# Patient Record
Sex: Female | Born: 1975 | Race: Black or African American | Hispanic: No | Marital: Single | Smoking: Current every day smoker
Health system: Southern US, Community
[De-identification: ages and names within clinical notes are randomized; demographics above are authoritative.]

## PROBLEM LIST (undated history)

## (undated) DIAGNOSIS — O021 Missed abortion: Secondary | ICD-10-CM

## (undated) DIAGNOSIS — IMO0002 Reserved for concepts with insufficient information to code with codable children: Secondary | ICD-10-CM

## (undated) DIAGNOSIS — A749 Chlamydial infection, unspecified: Secondary | ICD-10-CM

## (undated) DIAGNOSIS — A64 Unspecified sexually transmitted disease: Secondary | ICD-10-CM

## (undated) HISTORY — DX: Chlamydial infection, unspecified: A74.9

## (undated) HISTORY — DX: Reserved for concepts with insufficient information to code with codable children: IMO0002

## (undated) HISTORY — DX: Unspecified sexually transmitted disease: A64

---

## 1997-05-30 ENCOUNTER — Emergency Department (HOSPITAL_COMMUNITY): Admission: EM | Admit: 1997-05-30 | Discharge: 1997-05-30 | Payer: Self-pay | Admitting: Emergency Medicine

## 1997-06-03 ENCOUNTER — Encounter: Admission: RE | Admit: 1997-06-03 | Discharge: 1997-09-01 | Payer: Self-pay | Admitting: Family Medicine

## 1999-04-22 ENCOUNTER — Ambulatory Visit (HOSPITAL_COMMUNITY): Admission: RE | Admit: 1999-04-22 | Discharge: 1999-04-22 | Payer: Self-pay | Admitting: *Deleted

## 1999-04-22 ENCOUNTER — Encounter: Payer: Self-pay | Admitting: *Deleted

## 1999-05-18 ENCOUNTER — Inpatient Hospital Stay (HOSPITAL_COMMUNITY): Admission: AD | Admit: 1999-05-18 | Discharge: 1999-05-18 | Payer: Self-pay | Admitting: *Deleted

## 1999-06-02 ENCOUNTER — Encounter: Payer: Self-pay | Admitting: *Deleted

## 1999-06-02 ENCOUNTER — Inpatient Hospital Stay (HOSPITAL_COMMUNITY): Admission: AD | Admit: 1999-06-02 | Discharge: 1999-06-04 | Payer: Self-pay | Admitting: *Deleted

## 1999-06-02 ENCOUNTER — Ambulatory Visit (HOSPITAL_COMMUNITY): Admission: RE | Admit: 1999-06-02 | Discharge: 1999-06-02 | Payer: Self-pay | Admitting: *Deleted

## 1999-09-10 ENCOUNTER — Emergency Department (HOSPITAL_COMMUNITY): Admission: EM | Admit: 1999-09-10 | Discharge: 1999-09-10 | Payer: Self-pay | Admitting: Emergency Medicine

## 2002-10-25 ENCOUNTER — Other Ambulatory Visit: Admission: RE | Admit: 2002-10-25 | Discharge: 2002-10-25 | Payer: Self-pay | Admitting: Family Medicine

## 2004-07-20 ENCOUNTER — Other Ambulatory Visit: Admission: RE | Admit: 2004-07-20 | Discharge: 2004-07-20 | Payer: Self-pay | Admitting: Family Medicine

## 2005-10-07 ENCOUNTER — Other Ambulatory Visit: Admission: RE | Admit: 2005-10-07 | Discharge: 2005-10-07 | Payer: Self-pay | Admitting: Family Medicine

## 2007-02-02 DIAGNOSIS — IMO0002 Reserved for concepts with insufficient information to code with codable children: Secondary | ICD-10-CM

## 2007-02-02 HISTORY — DX: Reserved for concepts with insufficient information to code with codable children: IMO0002

## 2007-02-02 HISTORY — PX: COLPOSCOPY: SHX161

## 2007-06-28 ENCOUNTER — Other Ambulatory Visit: Admission: RE | Admit: 2007-06-28 | Discharge: 2007-06-28 | Payer: Self-pay | Admitting: Family Medicine

## 2010-06-19 NOTE — H&P (Signed)
[facility]  Patient:    Anne Reynolds, Anne Reynolds                    MRN: [id number] Adm. Date:  [id number] Attending:  Deniece Ree                         History and Physical  HISTORY OF PRESENT ILLNESS:   The patient is a 35 year old, gravida 2, para 1, ho came to my office today for a routine prenatal care and it was noted at that time that no fetal heart beats were attainable.  An ultrasound was then obtained which revealed a fetal demise.  The patient is approximately [redacted] weeks gestation and indicated that she thought she had felt the baby move, however, was not sure because she did not know when she was feeling it versus not feeling it.  PAST MEDICAL HISTORY:         Significant in that the patient in 1995 had a female infant weighing 1 pound 12 ounces secondary to a cesarean section at the gestational age of [redacted] weeks because of premature rupture of membranes and a prolapsed cord.  She had not had any problems during the present pregnancy.  PHYSICAL EXAMINATION:  GENERAL:                      Revealed a well-developed, well-nourished, gravid  female in mental-type distress.  HEENT:                        Within normal limits.  NECK:                         Supple.  BREASTS:                      Without masses, tenderness, or discharge.  LUNGS:                        Clear to auscultation and percussion.  HEART:                        Normal sinus rhythm without murmurs, rubs, or gallops.  ABDOMEN:                      Approximately [redacted] weeks gestational size.  No fetal heart tones are audible.  EXTREMITIES: NEUROLOGICAL:    Within normal limits.  PELVIC:                       Revealed external genitalia and BUS to be within normal limits.  The vagina is clear.  The cervix is closed, posterior, and without effacement.  The adnexa is benign.  ADMISSION DIAGNOSES:          Intrauterine pregnancy at approximately [redacted] weeks gestation  with fetal demise.  PLAN:                         For admission and delivery to utilize Cervidil to  institute the delivery process. DD:  06/02/99 TD:  06/04/99 Job: [id number]

## 2010-06-19 NOTE — Discharge Summary (Signed)
[facility]  Patient:    Anne Reynolds, Anne Reynolds                    MRN: [id number] Adm. Date:  [id number] Disc. Date: [id number] Attending:  Deniece Ree                           Discharge Summary  DISCHARGE DIAGNOSES:          1. Intrauterine pregnancy at approximately 26 weeks.                               2. Fetal demise.  PROCEDURE:                    Vaginal delivery.  HISTORY OF PRESENT ILLNESS:   The patient is a 35 year old, gravida 2, para 1, ho came to my office for a routine evaluation and was noted not to have fetal heart tones obtainable.  An ultrasound was obtained which revealed fetal demise. This was explained to the patient at which time a decision was to proceed with delivery.  HOSPITAL COURSE:              The patient was admitted for delivery at which time Cervidil was utilized to help in the delivery process.  The patient progressed satisfactorily and the patient progressed and had a spontaneous vaginal delivery of a female infant with Apgars of 0 and 0.  A discussion over this type of occurrence was carried out with the patient and family members who all understood. Postdelivery, the patient did very well without any problems and was discharged on the second postdelivery day. DD:  07/08/99 TD:  07/09/99 Job: [id number]

## 2010-12-23 ENCOUNTER — Other Ambulatory Visit: Payer: Self-pay | Admitting: Obstetrics and Gynecology

## 2010-12-23 DIAGNOSIS — E041 Nontoxic single thyroid nodule: Secondary | ICD-10-CM

## 2010-12-28 ENCOUNTER — Other Ambulatory Visit: Payer: Self-pay

## 2011-01-11 ENCOUNTER — Other Ambulatory Visit: Payer: Self-pay

## 2011-07-16 ENCOUNTER — Encounter (HOSPITAL_COMMUNITY): Payer: Self-pay | Admitting: *Deleted

## 2011-07-16 ENCOUNTER — Inpatient Hospital Stay (HOSPITAL_COMMUNITY): Payer: Medicaid Other

## 2011-07-16 ENCOUNTER — Inpatient Hospital Stay (HOSPITAL_COMMUNITY)
Admission: AD | Admit: 2011-07-16 | Discharge: 2011-07-16 | Disposition: A | Discharge status: Home / Self Care | Payer: Medicaid Other | Source: Ambulatory Visit | Attending: Obstetrics & Gynecology | Admitting: Obstetrics & Gynecology

## 2011-07-16 DIAGNOSIS — A499 Bacterial infection, unspecified: Secondary | ICD-10-CM | POA: Insufficient documentation

## 2011-07-16 DIAGNOSIS — O200 Threatened abortion: Secondary | ICD-10-CM

## 2011-07-16 DIAGNOSIS — O2390 Unspecified genitourinary tract infection in pregnancy, unspecified trimester: Secondary | ICD-10-CM | POA: Insufficient documentation

## 2011-07-16 DIAGNOSIS — N760 Acute vaginitis: Secondary | ICD-10-CM | POA: Insufficient documentation

## 2011-07-16 DIAGNOSIS — B9689 Other specified bacterial agents as the cause of diseases classified elsewhere: Secondary | ICD-10-CM | POA: Insufficient documentation

## 2011-07-16 DIAGNOSIS — R109 Unspecified abdominal pain: Secondary | ICD-10-CM | POA: Insufficient documentation

## 2011-07-16 LAB — WET PREP, GENITAL

## 2011-07-16 LAB — HCG, QUANTITATIVE, PREGNANCY: hCG, Beta Chain, Quant, S: 942 m[IU]/mL — ABNORMAL HIGH (ref <5)

## 2011-07-16 MED ORDER — METRONIDAZOLE 500 MG PO TABS: 500.0000 mg | ORAL_TABLET | Freq: Three times a day (TID) | ORAL | Status: AC

## 2011-07-16 NOTE — MAU Note (Signed)
Pt complains of cramping and sharp pains for approximately 3 weeks. Pt denies any bleeding and states last period was May the 3rd. States she is worried because she had complications with last 2 pregnancies

## 2011-07-16 NOTE — Discharge Instructions (Signed)
Threatened Miscarriage  Bleeding during the first 20 weeks of pregnancy is common. This is sometimes called a threatened miscarriage. This is a pregnancy that is threatening to end before the twentieth week of pregnancy. Often this bleeding stops with bed rest or decreased activities as suggested by your caregiver and the pregnancy continues without any more problems. You may be asked to not have sexual intercourse, have orgasms or use tampons until further notice. Sometimes a threatened miscarriage can progress to a complete or incomplete miscarriage. This may or may not require further treatment. Some miscarriages occur before a woman misses a menstrual period and knows she is pregnant.  Miscarriages occur in 15 to 20% of all pregnancies and usually occur during the first 13 weeks of the pregnancy. The exact cause of a miscarriage is usually never known. A miscarriage is natures way of ending a pregnancy that is abnormal or would not make it to term. There are some things that may put you at risk to have a miscarriage, such as:   Hormone problems.   Infection of the uterus or cervix.   Chronic illness, diabetes for example, especially if it is not controlled.   Abnormal shaped uterus.   Fibroids in the uterus.   Incompetent cervix (the cervix is too weak to hold the baby).   Smoking.   Drinking too much alcohol. It's best not to drink any alcohol when you are pregnant.   Taking illegal drugs.  TREATMENT   When a miscarriage becomes complete and all products of conception (all the tissue in the uterus) have been passed, often no treatment is needed. If you think you passed tissue, save it in a container and take it to your doctor for evaluation. If the miscarriage is incomplete (parts of the fetus or placenta remain in the uterus), further treatment may be needed. The most common reason for further treatment is continued bleeding (hemorrhage) because pregnancy tissue did not pass out of the uterus. This  often occurs if a miscarriage is incomplete. Tissue left behind may also become infected. Treatment usually is dilatation and curettage (the removal of the remaining products of pregnancy. This can be done by a simple sucking procedure (suction curettage) or a simple scraping of the inside of the uterus. This may be done in the hospital or in the caregiver's office. This is only done when your caregiver knows that there is no chance for the pregnancy to proceed to term. This is determined by physical examination, negative pregnancy test, falling pregnancy hormone count and/or, an ultrasound revealing a dead fetus.  Miscarriages are often a very emotional time for prospective mothers and fathers. This is not you or your partners fault. It did not occur because of an inadequacy in you or your partner. Nearly all miscarriages occur because the pregnancy has started off wrongly. At least half of these pregnancies have a chromosomal abnormality. It is almost always not inherited. Others may have developmental problems with the fetus or placenta. This does not always show up even when the products miscarried are studied under the microscope. The miscarriage is nearly always not your fault and it is not likely that you could have prevented it from happening. If you are having emotional and grieving problems, talk to your health care provider and even seek counseling, if necessary, before getting pregnant again. You can begin trying for another pregnancy as soon as your caregiver says it is OK.  HOME CARE INSTRUCTIONS    Your caregiver may order   and cramping you are having. You may be limited to only getting up to go to the bathroom. You may be allowed to continue light activity. You may need to make arrangements for the care of your other children and for any other responsibilities.   Keep track of the number of pads you use each day, how often you have to change pads  and how saturated (soaked) they are. Record this information.   DO NOT USE TAMPONS. Do not douche, have sexual intercourse or orgasms until approved by your caregiver.   You may receive a follow up appointment for re-evaluation of your pregnancy and a repeat blood test. Re-evaluation often occurs after 2 days and again in 4 to 6 weeks. It is very important that you follow-up in the recommended time period.   If you are Rh negative and the father is Rh positive or you do not know the fathers' blood type, you may receive a shot (Rh immune globulin) to help prevent abnormal antibodies that can develop and affect the baby in any future pregnancies.  SEEK IMMEDIATE MEDICAL CARE IF:  You have severe cramps in your stomach, back, or abdomen.   You have a sudden onset of severe pain in the lower part of your abdomen.   You develop chills.   You run an unexplained temperature of 101 F (38.3 C) or higher.   You pass large clots or tissue. Save any tissue for your caregiver to inspect.   Your bleeding increases or you become light-headed, weak, or have fainting episodes.   You have a gush of fluid from your vagina.   You pass out. This could mean you have a tubal (ectopic) pregnancy.  Document Released: 01/18/2005 Document Revised: 01/07/2011 Document Reviewed: 09/04/2007 [facility] Patient Information 2012 [place], [place].  Bacterial Vaginosis Bacterial vaginosis (BV) is a vaginal infection where the normal balance of bacteria in the vagina is disrupted. The normal balance is then replaced by an overgrowth of certain bacteria. There are several different kinds of bacteria that can cause BV. BV is the most common vaginal infection in women of childbearing age. CAUSES   The cause of BV is not fully understood. BV develops when there is an increase or imbalance of harmful bacteria.   Some activities or behaviors can upset the normal balance of bacteria in the vagina and put women at increased  risk including:   Having a new sex partner or multiple sex partners.   Douching.   Using an intrauterine device (IUD) for contraception.   It is not clear what role sexual activity plays in the development of BV. However, women that have never had sexual intercourse are rarely infected with BV.  Women do not get BV from toilet seats, bedding, swimming pools or from touching objects around them.  SYMPTOMS   Grey vaginal discharge.   A fish-like odor with discharge, especially after sexual intercourse.   Itching or burning of the vagina and vulva.   Burning or pain with urination.   Some women have no signs or symptoms at all.  DIAGNOSIS  Your caregiver must examine the vagina for signs of BV. Your caregiver will perform lab tests and look at the sample of vaginal fluid through a microscope. They will look for bacteria and abnormal cells (clue cells), a pH test higher than 4.5, and a positive amine test all associated with BV.  RISKS AND COMPLICATIONS   Pelvic inflammatory disease (PID).   Infections following gynecology surgery.   Developing  HIV.   Developing herpes virus.  TREATMENT  Sometimes BV will clear up without treatment. However, all women with symptoms of BV should be treated to avoid complications, especially if gynecology surgery is planned. Female partners generally do not need to be treated. However, BV may spread between female sex partners so treatment is helpful in preventing a recurrence of BV.   BV may be treated with antibiotics. The antibiotics come in either pill or vaginal cream forms. Either can be used with nonpregnant or pregnant women, but the recommended dosages differ. These antibiotics are not harmful to the baby.   BV can recur after treatment. If this happens, a second round of antibiotics will often be prescribed.   Treatment is important for pregnant women. If not treated, BV can cause a premature delivery, especially for a pregnant woman who had  a premature birth in the past. All pregnant women who have symptoms of BV should be checked and treated.   For chronic reoccurrence of BV, treatment with a type of prescribed gel vaginally twice a week is helpful.  HOME CARE INSTRUCTIONS   Finish all medication as directed by your caregiver.   Do not have sex until treatment is completed.   Tell your sexual partner that you have a vaginal infection. They should see their caregiver and be treated if they have problems, such as a mild rash or itching.   Practice safe sex. Use condoms. Only have 1 sex partner.  PREVENTION  Basic prevention steps can help reduce the risk of upsetting the natural balance of bacteria in the vagina and developing BV:  Do not have sexual intercourse (be abstinent).   Do not douche.   Use all of the medicine prescribed for treatment of BV, even if the signs and symptoms go away.   Tell your sex partner if you have BV. That way, they can be treated, if needed, to prevent reoccurrence.  SEEK MEDICAL CARE IF:   Your symptoms are not improving after 3 days of treatment.   You have increased discharge, pain, or fever.  MAKE SURE YOU:   Understand these instructions.   Will watch your condition.   Will get help right away if you are not doing well or get worse.  FOR MORE INFORMATION  Division of STD Prevention (DSTDP), Centers for Disease Control and Prevention: [employer] American Social Health Association (ASHA): www.ashastd.org  Document Released: 01/18/2005 Document Revised: 01/07/2011 Document Reviewed: 07/11/2008 [facility] Patient Information 2012 [place], [place].

## 2011-07-16 NOTE — MAU Provider Note (Signed)
History     CSN: [id number]  Arrival date and time: 07/16/11 1158   None     Chief Complaint  Patient presents with  . Abdominal Cramping   HPI This is a 36 y.o. female at 6.0 weeks by LMP who presents with c/o cramping pains in abdomen. Had + UPT at home. Denies abnormal bleeding. LMP 06/04/11  RN Note: Pt complains of cramping and sharp pains for approximately 3 weeks. Pt denies any bleeding and states last period was May the 3rd. States she is worried because she had complications with last 2 pregnancies  OB History    Grav Para Term Preterm Abortions TAB SAB Ect Mult Living   2 2 0 2 0 0 0 0 0 1       Past Medical History  Diagnosis Date  . Preterm labor     History reviewed. No pertinent past surgical history.  No family history on file.  History  Substance Use Topics  . Smoking status: Never Smoker   . Smokeless tobacco: Not on file  . Alcohol Use: No    Allergies: Not on File  Prescriptions prior to admission  Medication Sig Dispense Refill  . ibuprofen (ADVIL,MOTRIN) 200 MG tablet Take 200 mg by mouth every 6 (six) hours as needed. For cramping.      . Multiple Vitamins-Minerals (MULTIVITAMIN WITH MINERALS) tablet Take 1 tablet by mouth daily.        ROS See HPI  Physical Exam   Blood pressure 112/64, pulse 88, temperature 99.6 F (37.6 C), temperature source Oral, resp. rate 18.  Physical Exam  Constitutional: She is oriented to person, place, and time. She appears well-developed and well-nourished. No distress.  Cardiovascular: Normal rate.   Respiratory: Effort normal.  GI: Soft. She exhibits no distension and no mass. There is tenderness (slightly tender over SP area, no focal tenderness over adnexae). There is no rebound and no guarding.  Genitourinary: Vagina normal and uterus normal. No vaginal discharge found.       Uterus small, 6 wk size, nontender over adnexae   Musculoskeletal: Normal range of motion.  Neurological: She is alert and  oriented to person, place, and time.  Skin: Skin is warm and dry.  Psychiatric: She has a normal mood and affect.   Results for orders placed during the hospital encounter of 07/16/11 (from the past 24 hour(s))  POCT PREGNANCY, URINE     Status: Abnormal   Collection Time   07/16/11 12:47 PM      Component Value Range   Preg Test, Ur POSITIVE (*) NEGATIVE  HCG, QUANTITATIVE, PREGNANCY     Status: Abnormal   Collection Time   07/16/11  1:08 PM      Component Value Range   hCG, Beta Chain, Quant, S 942 (*) <5 mIU/mL  WET PREP, GENITAL     Status: Abnormal   Collection Time   07/16/11  1:45 PM      Component Value Range   Yeast Wet Prep HPF POC NONE SEEN  NONE SEEN   Trich, Wet Prep NONE SEEN  NONE SEEN   Clue Cells Wet Prep HPF POC MODERATE (*) NONE SEEN   WBC, Wet Prep HPF POC FEW (*) NONE SEEN    MAU Course  Procedures  MDM Will check Quant and [place].    Assessment and Plan  A:  Pregnancy at early gestation, Early IUP vs ectopic with pseudosac      BV  P:  Discharge home.  Repeat Quant on Monday per Dr Tresa Res then have patient come to their office to be seen.  (order for Quant HCG placed for Monday for outpatient lab).  Will not need to be seen in MAU.      Rx Flagyl for BV  [facility] 07/16/2011, 2:46 PM

## 2011-07-19 ENCOUNTER — Inpatient Hospital Stay (HOSPITAL_COMMUNITY)
Admission: AD | Admit: 2011-07-19 | Discharge: 2011-07-19 | Disposition: A | Discharge status: Home / Self Care | Payer: Medicaid Other | Source: Ambulatory Visit | Attending: Obstetrics & Gynecology | Admitting: Obstetrics & Gynecology

## 2011-07-19 NOTE — MAU Note (Signed)
Per note by Artelia Laroche. Pt to have BHCG  Outpatient lab only, then to follow up with Dr Romine's office.  Discussed/explained to pt.

## 2011-07-20 ENCOUNTER — Other Ambulatory Visit: Payer: Self-pay | Admitting: Obstetrics & Gynecology

## 2011-07-20 DIAGNOSIS — O200 Threatened abortion: Secondary | ICD-10-CM

## 2011-07-21 ENCOUNTER — Other Ambulatory Visit: Payer: Self-pay | Admitting: Obstetrics & Gynecology

## 2011-07-21 ENCOUNTER — Inpatient Hospital Stay (HOSPITAL_COMMUNITY)
Admission: AD | Admit: 2011-07-21 | Discharge: 2011-07-21 | Disposition: A | Discharge status: Home / Self Care | Payer: Medicaid Other | Source: Ambulatory Visit | Attending: Obstetrics & Gynecology | Admitting: Obstetrics & Gynecology

## 2011-07-22 ENCOUNTER — Other Ambulatory Visit: Payer: Self-pay | Admitting: Obstetrics & Gynecology

## 2011-07-22 ENCOUNTER — Ambulatory Visit (HOSPITAL_COMMUNITY)
Admission: RE | Admit: 2011-07-22 | Discharge: 2011-07-22 | Disposition: A | Discharge status: Home / Self Care | Payer: Medicaid Other | Source: Ambulatory Visit | Attending: Obstetrics & Gynecology | Admitting: Obstetrics & Gynecology

## 2011-07-22 DIAGNOSIS — O09299 Supervision of pregnancy with other poor reproductive or obstetric history, unspecified trimester: Secondary | ICD-10-CM | POA: Insufficient documentation

## 2011-07-22 DIAGNOSIS — O3680X0 Pregnancy with inconclusive fetal viability, not applicable or unspecified: Secondary | ICD-10-CM | POA: Insufficient documentation

## 2011-07-22 DIAGNOSIS — O0281 Inappropriate change in quantitative human chorionic gonadotropin (hCG) in early pregnancy: Secondary | ICD-10-CM

## 2011-07-22 DIAGNOSIS — O34219 Maternal care for unspecified type scar from previous cesarean delivery: Secondary | ICD-10-CM | POA: Insufficient documentation

## 2011-07-26 ENCOUNTER — Other Ambulatory Visit: Payer: Self-pay | Admitting: Obstetrics & Gynecology

## 2011-07-26 DIAGNOSIS — O0281 Inappropriate change in quantitative human chorionic gonadotropin (hCG) in early pregnancy: Secondary | ICD-10-CM

## 2011-07-28 ENCOUNTER — Encounter (HOSPITAL_COMMUNITY): Payer: Self-pay | Admitting: Pharmacy Technician

## 2011-07-28 ENCOUNTER — Encounter (HOSPITAL_COMMUNITY): Payer: Self-pay | Admitting: *Deleted

## 2011-07-28 ENCOUNTER — Ambulatory Visit (HOSPITAL_COMMUNITY)
Admission: RE | Admit: 2011-07-28 | Discharge: 2011-07-28 | Disposition: A | Discharge status: Home / Self Care | Payer: Medicaid Other | Source: Ambulatory Visit | Attending: Obstetrics & Gynecology | Admitting: Obstetrics & Gynecology

## 2011-07-28 DIAGNOSIS — O0281 Inappropriate change in quantitative human chorionic gonadotropin (hCG) in early pregnancy: Secondary | ICD-10-CM

## 2011-07-28 DIAGNOSIS — O34219 Maternal care for unspecified type scar from previous cesarean delivery: Secondary | ICD-10-CM | POA: Insufficient documentation

## 2011-07-28 DIAGNOSIS — O09299 Supervision of pregnancy with other poor reproductive or obstetric history, unspecified trimester: Secondary | ICD-10-CM | POA: Insufficient documentation

## 2011-07-28 DIAGNOSIS — O3680X0 Pregnancy with inconclusive fetal viability, not applicable or unspecified: Secondary | ICD-10-CM | POA: Insufficient documentation

## 2011-08-02 ENCOUNTER — Encounter (HOSPITAL_COMMUNITY): Payer: Self-pay | Admitting: *Deleted

## 2011-08-02 ENCOUNTER — Encounter (HOSPITAL_COMMUNITY): Payer: Self-pay

## 2011-08-02 ENCOUNTER — Ambulatory Visit (HOSPITAL_COMMUNITY)
Admission: RE | Admit: 2011-08-02 | Discharge: 2011-08-02 | Disposition: A | Discharge status: Home / Self Care | Payer: Medicaid Other | Source: Ambulatory Visit | Attending: Obstetrics & Gynecology | Admitting: Obstetrics & Gynecology

## 2011-08-02 ENCOUNTER — Ambulatory Visit (HOSPITAL_COMMUNITY): Payer: Medicaid Other

## 2011-08-02 ENCOUNTER — Encounter (HOSPITAL_COMMUNITY)
Admission: RE | Disposition: A | Discharge status: Home / Self Care | Payer: Self-pay | Source: Ambulatory Visit | Attending: Obstetrics & Gynecology

## 2011-08-02 DIAGNOSIS — N840 Polyp of corpus uteri: Secondary | ICD-10-CM | POA: Diagnosis present

## 2011-08-02 DIAGNOSIS — O020 Blighted ovum and nonhydatidiform mole: Secondary | ICD-10-CM | POA: Diagnosis present

## 2011-08-02 DIAGNOSIS — O021 Missed abortion: Secondary | ICD-10-CM | POA: Insufficient documentation

## 2011-08-02 HISTORY — PX: DILATION AND EVACUATION: SHX1459

## 2011-08-02 HISTORY — PX: DILATION AND CURETTAGE OF UTERUS: SHX78

## 2011-08-02 HISTORY — DX: Missed abortion: O02.1

## 2011-08-02 LAB — CBC
Hemoglobin: 13.2 g/dL (ref 12.0–15.0)
MCHC: 34.4 g/dL (ref 30.0–36.0)
RBC: 4.29 MIL/uL (ref 3.87–5.11)

## 2011-08-02 LAB — ABO/RH: ABO/RH(D): O POS

## 2011-08-02 SURGERY — DILATION AND EVACUATION, UTERUS
Anesthesia: Monitor Anesthesia Care | Site: Vagina | Wound class: Clean Contaminated

## 2011-08-02 MED ORDER — HYDROCODONE-ACETAMINOPHEN 5-325 MG PO TABS
ORAL_TABLET | ORAL | Status: AC
  Administered 2011-08-02: 2 via ORAL
  Filled 2011-08-02: qty 2

## 2011-08-02 MED ORDER — LIDOCAINE HCL (CARDIAC) 20 MG/ML IV SOLN
INTRAVENOUS | Status: AC
  Filled 2011-08-02: qty 5

## 2011-08-02 MED ORDER — ONDANSETRON HCL 4 MG/2ML IJ SOLN
INTRAMUSCULAR | Status: DC | PRN
  Administered 2011-08-02: 4 mg via INTRAVENOUS

## 2011-08-02 MED ORDER — FENTANYL CITRATE 0.05 MG/ML IJ SOLN
25.0000 ug | INTRAMUSCULAR | Status: DC | PRN
  Administered 2011-08-02: 50 ug via INTRAVENOUS

## 2011-08-02 MED ORDER — ATROPINE SULFATE 0.4 MG/ML IJ SOLN
INTRAMUSCULAR | Status: DC | PRN
  Administered 2011-08-02: 0.4 mg via INTRAVENOUS

## 2011-08-02 MED ORDER — ONDANSETRON HCL 4 MG/2ML IJ SOLN
INTRAMUSCULAR | Status: AC
  Filled 2011-08-02: qty 2

## 2011-08-02 MED ORDER — MEPERIDINE HCL 25 MG/ML IJ SOLN: 6.2500 mg | INTRAMUSCULAR | Status: DC | PRN

## 2011-08-02 MED ORDER — LIDOCAINE-EPINEPHRINE 1 %-1:100000 IJ SOLN
INTRAMUSCULAR | Status: DC | PRN
  Administered 2011-08-02: 10 mL

## 2011-08-02 MED ORDER — METOCLOPRAMIDE HCL 5 MG/ML IJ SOLN: 10.0000 mg | Freq: Once | INTRAMUSCULAR | Status: DC | PRN

## 2011-08-02 MED ORDER — KETOROLAC TROMETHAMINE 30 MG/ML IJ SOLN
INTRAMUSCULAR | Status: DC | PRN
  Administered 2011-08-02: 30 mg via INTRAMUSCULAR
  Administered 2011-08-02: 30 mg via INTRAVENOUS

## 2011-08-02 MED ORDER — FENTANYL CITRATE 0.05 MG/ML IJ SOLN
INTRAMUSCULAR | Status: AC
  Filled 2011-08-02: qty 2

## 2011-08-02 MED ORDER — DEXAMETHASONE SODIUM PHOSPHATE 10 MG/ML IJ SOLN
INTRAMUSCULAR | Status: AC
  Filled 2011-08-02: qty 1

## 2011-08-02 MED ORDER — LACTATED RINGERS IV SOLN
INTRAVENOUS | Status: DC
  Administered 2011-08-02 (×3): via INTRAVENOUS

## 2011-08-02 MED ORDER — PROPOFOL 10 MG/ML IV EMUL
INTRAVENOUS | Status: DC | PRN
  Administered 2011-08-02: 20 mg via INTRAVENOUS
  Administered 2011-08-02 (×4): 50 mg via INTRAVENOUS
  Administered 2011-08-02: 30 mg via INTRAVENOUS

## 2011-08-02 MED ORDER — KETOROLAC TROMETHAMINE 60 MG/2ML IM SOLN
INTRAMUSCULAR | Status: AC
  Filled 2011-08-02: qty 2

## 2011-08-02 MED ORDER — CEFAZOLIN SODIUM 1-5 GM-% IV SOLN
INTRAVENOUS | Status: AC
  Filled 2011-08-02: qty 50

## 2011-08-02 MED ORDER — PROPOFOL 10 MG/ML IV EMUL
INTRAVENOUS | Status: AC
  Filled 2011-08-02: qty 20

## 2011-08-02 MED ORDER — FENTANYL CITRATE 0.05 MG/ML IJ SOLN
INTRAMUSCULAR | Status: AC
  Administered 2011-08-02: 50 ug via INTRAVENOUS
  Filled 2011-08-02: qty 2

## 2011-08-02 MED ORDER — MIDAZOLAM HCL 5 MG/5ML IJ SOLN
INTRAMUSCULAR | Status: DC | PRN
  Administered 2011-08-02: 2 mg via INTRAVENOUS

## 2011-08-02 MED ORDER — CEFAZOLIN SODIUM 1-5 GM-% IV SOLN
1.0000 g | Freq: Three times a day (TID) | INTRAVENOUS | Status: DC
  Administered 2011-08-02: 1 g via INTRAVENOUS
  Filled 2011-08-02 (×5): qty 50

## 2011-08-02 MED ORDER — FENTANYL CITRATE 0.05 MG/ML IJ SOLN
INTRAMUSCULAR | Status: DC | PRN
  Administered 2011-08-02 (×2): 50 ug via INTRAVENOUS

## 2011-08-02 MED ORDER — HYDROCODONE-ACETAMINOPHEN 5-500 MG PO TABS: 2.0000 | ORAL_TABLET | Freq: Four times a day (QID) | ORAL | Status: AC | PRN

## 2011-08-02 MED ORDER — DEXAMETHASONE SODIUM PHOSPHATE 4 MG/ML IJ SOLN
INTRAMUSCULAR | Status: DC | PRN
  Administered 2011-08-02: 10 mg via INTRAVENOUS

## 2011-08-02 MED ORDER — MIDAZOLAM HCL 2 MG/2ML IJ SOLN
INTRAMUSCULAR | Status: AC
  Filled 2011-08-02: qty 2

## 2011-08-02 MED ORDER — HYDROCODONE-ACETAMINOPHEN 5-325 MG PO TABS
2.0000 | ORAL_TABLET | Freq: Once | ORAL | Status: AC
  Administered 2011-08-02: 2 via ORAL

## 2011-08-02 MED ORDER — ATROPINE SULFATE 0.4 MG/ML IJ SOLN
INTRAMUSCULAR | Status: AC
  Filled 2011-08-02: qty 1

## 2011-08-02 SURGICAL SUPPLY — 21 items
CATH ROBINSON RED A/P 16FR (CATHETERS) ×2 IMPLANT
CLOTH BEACON ORANGE TIMEOUT ST (SAFETY) ×2 IMPLANT
DECANTER SPIKE VIAL GLASS SM (MISCELLANEOUS) ×2 IMPLANT
GLOVE BIOGEL PI IND STRL 7.0 (GLOVE) ×1 IMPLANT
GLOVE BIOGEL PI INDICATOR 7.0 (GLOVE) ×1
GLOVE ECLIPSE 6.5 STRL STRAW (GLOVE) ×4 IMPLANT
GOWN PREVENTION PLUS LG XLONG (DISPOSABLE) ×4 IMPLANT
KIT BERKELEY 1ST TRIMESTER 3/8 (MISCELLANEOUS) ×2 IMPLANT
NDL SPNL 22GX3.5 QUINCKE BK (NEEDLE) ×1 IMPLANT
NEEDLE SPNL 22GX3.5 QUINCKE BK (NEEDLE) ×2 IMPLANT
NS IRRIG 1000ML POUR BTL (IV SOLUTION) ×2 IMPLANT
PACK VAGINAL MINOR WOMEN LF (CUSTOM PROCEDURE TRAY) ×2 IMPLANT
PAD PREP 24X48 CUFFED NSTRL (MISCELLANEOUS) ×2 IMPLANT
SET BERKELEY SUCTION TUBING (SUCTIONS) ×2 IMPLANT
SYR CONTROL 10ML LL (SYRINGE) ×2 IMPLANT
TOWEL OR 17X24 6PK STRL BLUE (TOWEL DISPOSABLE) ×4 IMPLANT
VACURETTE 10 RIGID CVD (CANNULA) IMPLANT
VACURETTE 6 ASPIR F TIP BERK (CANNULA) ×1 IMPLANT
VACURETTE 7MM CVD STRL WRAP (CANNULA) IMPLANT
VACURETTE 8 RIGID CVD (CANNULA) IMPLANT
VACURETTE 9 RIGID CVD (CANNULA) IMPLANT

## 2011-08-02 NOTE — Op Note (Signed)
08/02/2011  12:49 PM  PATIENT:  Anne Reynolds  36 y.o. female  PRE-OPERATIVE DIAGNOSIS:  Anembryonic pregnancy,endometrial polyp  POST-OPERATIVE DIAGNOSIS:  Same  PROCEDURE:  Procedure(s): DILATATION AND EVACUATION  SURGEON:  Zilpha Mcandrew SUZANNE  ASSISTANTS: OR staff   ANESTHESIA:   MAC  ESTIMATED BLOOD LOSS: * No blood loss amount entered *  BLOOD ADMINISTERED:none   FLUIDS: 600cc LR  UOP: 150cc drained at beginning of case with latex free catheter  SPECIMEN:  Products of conceptions, possible polyp  DISPOSITION OF SPECIMEN:  PATHOLOGY  FINDINGS: eight week sized uterus  DESCRIPTION OF OPERATION: Patient was taken to the operating room. She is placed in the supine position. Anesthesia was administered by the anesthesia staff without difficulty. Dr. foster oversaw case SCDs were on the patient's lower extremities and functioning properly.  The legs were then placed in the low lithotomy position in the [place] stirrups. A timeout was performed. Legs are lifted to the high lithotomy position.  The perineum, inner thighs, and vagina were prepped in a normal standard fashion with Betadine prep. The patient was then draped in a normal standard fashion. A Foley catheter is used to drain the bladder of all urine with an in and out catheterization. Next a bivalve speculum was placed in the vagina. The cervix was well visualized. The anterior lip of the cervix was grasped with single-tooth tenaculum. A paracervical block using 1% lidocaine mixed one-to-one with epinephrine (1:100,000 units) was used. 10 cc total was used for the paracervical block. The uterus then sounded to 8 cm. The cervix was dilated up to #21 using Pratt dilators.  A #6 flexible suction tip was obtained and passed into the endometrial cavity. Suction was applied. Using a clockwise rotating motion the endometrial cavity was evacuated of all tissue and blood. The suction never got above 60 during the procedure. Several  passes with the suction tip was performed. The dissection tip was removed and using a #1 smooth curette endometrial cavity was curetted. A rough gritty tissue is noted in all quadrants.   An endometrial polyp was seen on ultrasound in Nov, 2012.  I could not feel the presence of a polyp with curetting.  Therefore, no hysteroscopy was performed.  A final pass with the suction tip in the endometrial cavity was performed. Minimal blood was noted and no tissue was noted. At this point the procedure was ended. The tenaculum was removed from the anterior lip of the cervix. The speculum was removed from the vagina. The Betadine prep was washed off the patient's skin and her legs are placed back in the supine position. Sponge, lap, needle, initially counts were correct x2. The procedure was performed without any complications and the patient was awakened and taken to recovery in stable condition. An in and  COUNTS:  YES  PLAN OF CARE: Transfer to PACU

## 2011-08-02 NOTE — Transfer of Care (Signed)
Immediate Anesthesia Transfer of Care Note  Patient: Anne Reynolds  Procedure(s) Performed: Procedure(s) (LRB): DILATATION AND EVACUATION (N/A)  Patient Location: PACU  Anesthesia Type: MAC  Level of Consciousness: awake, alert  and oriented  Airway & Oxygen Therapy: Patient Spontanous Breathing and Patient connected to nasal cannula oxygen  Post-op Assessment: Report given to PACU RN and Post -op Vital signs reviewed and stable  Post vital signs: Reviewed and stable  Complications: No apparent anesthesia complications

## 2011-08-02 NOTE — Anesthesia Postprocedure Evaluation (Signed)
Anesthesia Post Note  Patient: Anne Reynolds  Procedure(s) Performed: Procedure(s) (LRB): DILATATION AND EVACUATION (N/A)  Anesthesia type: MAC  Patient location: PACU  Post pain: Pain level controlled  Post assessment: Post-op Vital signs reviewed  Last Vitals:  Filed Vitals:   08/02/11 1300  BP: 99/59  Pulse: 91  Temp:   Resp: 18    Post vital signs: Reviewed  Level of consciousness: sedated  Complications: No apparent anesthesia complications

## 2011-08-02 NOTE — Discharge Instructions (Signed)
Post-surgical Instructions, Outpatient Surgery  You may expect to feel dizzy, weak, and drowsy for as long as 24 hours after receiving the medicine that made you sleep (anesthetic). For the first 24 hours after your surgery:    Do not drive a car, ride a bicycle, participate in physical activities, or take public transportation until you are done taking narcotic pain medicines or as directed by Dr. Hyacinth Meeker.   Do not drink alcohol or take tranquilizers.   Do not take medicine that has not been prescribed by your physicians.   Do not sign important papers or make important decisions while on narcotic pain medicines.   Have a responsible person with you.   PAIN MANAGEMENT  Motrin 800mg .  (This is the same as 4-200mg  over the counter tablets of Motrin or ibuprofen.)  You may take this every eight hours or as needed for cramping.    Vicodin 5/500mg .  For more severe pain, take one or two tablets every four to six hours as needed for pain control.  (Remember that narcotic pain medications increase your risk of constipation.  If this becomes a problem, you may take an over the counter stool softener like Colace 100mg  up to four times a day.)  DO'S AND DON'T'S  Do not take a tub bath for one week.  You may shower on the first day after your surgery  Do not do any heavy lifting for one week.  This increases the chance of bleeding.  Do move around as you feel able.  Stairs are fine.  You may begin to exercise again as you feel able.  Do not lift any weights for two weeks.  Do not put anything in the vagina for two weeks--no tampons, intercourse, or douching.  No swimming or hot tub use is permitted during these two weeks as well.  REGULAR MEDIATIONS/VITAMINS:  You may restart all of your regular medications as prescribed.  You may restart all of your vitamins as you normally take them.    PLEASE CALL OR SEEK MEDICAL CARE IF:  You have persistent nausea and vomiting.   You have trouble  eating or drinking.   You have an oral temperature above 100.5.   You have constipation that is not helped by adjusting diet or increasing fluid intake. Pain medicines are a common cause of constipation.   You have heavy vaginal bleeding

## 2011-08-02 NOTE — H&P (Signed)
Anne Reynolds is an 36 y.o. female G69P0110 (stillbirth x 1) with LMP of 06/04/11 here for suction D&C due to anembryonic pregnancy.  Her B-HCGs have been followed and did not rise appropriately.  She has undergone three ultrasounds all showing normal tubes and gestation sac with possible yolk sac but no embryo.  Because of this treatment with conservative management, methotrexate, or D&E discussed.  She and I have reviewed risks/benenfits and she is here and ready to proceed.  Pertinent Gynecological History: Menses: regular, LMP 06/04/11 Bleeding: regular Contraception: none DES exposure: denies Blood transfusions: none Sexually transmitted diseases: past history: chlamydia years ago Previous GYN Procedures: C/S x 1, colposcopy  Last mammogram: none Date: n/a Last pap: normal Date: 11/12 OB History: G3, P0110 (stilbirth x 1)   Menstrual History: Menarche age: 89 Patient's last menstrual period was 06/04/2011.    Past Medical History  Diagnosis Date  . Preterm labor   . Missed abortion current    Past Surgical History  Procedure Date  . Cesarean section 1995    emergency -general anesthesia    History reviewed. No pertinent family history.  Social History:  reports that she has never smoked. She does not have any smokeless tobacco history on file. She reports that she drinks alcohol. She reports that she does not use illicit drugs.  Allergies:  Allergies  Allergen Reactions  . Latex Hives    Hives with condoms    Prescriptions prior to admission  Medication Sig Dispense Refill  . ibuprofen (ADVIL,MOTRIN) 200 MG tablet Take 200 mg by mouth every 6 (six) hours as needed. For cramping.      . Multiple Vitamins-Minerals (MULTIVITAMIN WITH MINERALS) tablet Take 1 tablet by mouth daily.        Review of Systems  Constitutional: Negative for fever and chills.  Eyes: Negative for blurred vision.  Respiratory: Negative for cough.   Cardiovascular: Negative for chest pain  and palpitations.  Gastrointestinal: Negative for heartburn, nausea, vomiting and abdominal pain.  Genitourinary: Negative for dysuria.  Musculoskeletal: Negative for myalgias.  Skin: Negative for rash.  Neurological: Negative for dizziness and headaches.  Endo/Heme/Allergies: Does not bruise/bleed easily.  Psychiatric/Behavioral: Negative for depression.    Blood pressure 111/69, pulse 99, temperature 98.4 F (36.9 C), temperature source Oral, resp. rate 18, height 5\' 2"  (1.575 m), weight 68.04 kg (150 lb), last menstrual period 06/04/2011, SpO2 98.00%, unknown if currently breastfeeding. Physical Exam  Vitals reviewed. Constitutional: She is oriented to person, place, and time. She appears well-developed and well-nourished.  HENT:  Head: Normocephalic and atraumatic.  Neck: Normal range of motion. Neck supple.  Cardiovascular: Normal rate and regular rhythm.   Respiratory: Effort normal and breath sounds normal.  GI: Soft. Bowel sounds are normal.  Musculoskeletal: Normal range of motion.  Neurological: She is alert and oriented to person, place, and time.  Skin: Skin is warm and dry.  Psychiatric: She has a normal mood and affect.    Results for orders placed during the hospital encounter of 08/02/11 (from the past 24 hour(s))  ABO/RH     Status: Normal   Collection Time   08/02/11 10:10 AM      Component Value Range   ABO/RH(D) O POS    CBC     Status: Normal   Collection Time   08/02/11 10:11 AM      Component Value Range   WBC 8.3  4.0 - 10.5 K/uL   RBC 4.29  3.87 - 5.11 MIL/uL  Hemoglobin 13.2  12.0 - 15.0 g/dL   HCT 16.1  09.6 - 04.5 %   MCV 89.5  78.0 - 100.0 fL   MCH 30.8  26.0 - 34.0 pg   MCHC 34.4  30.0 - 36.0 g/dL   RDW 40.9  81.1 - 91.4 %   Platelets 196  150 - 400 K/uL    No results found.  Assessment/Plan: 36 y.o. female G34P0110 (stillbirth x 1) with LMP of 06/04/11 here for suction D&C due to anembryonic pregnancy.  She does have a history of endometrial  polyp and I am going to try and remove this at the same time.  Risks and benefits were discussed and she is ready to proceed.  Anne Reynolds SUZANNE 08/02/2011, 11:59 AM

## 2011-08-02 NOTE — Anesthesia Preprocedure Evaluation (Addendum)
Anesthesia Evaluation  Patient identified by MRN, date of birth, ID band Patient awake    Reviewed: Allergy & Precautions, H&P , NPO status , Patient's Chart, lab work & pertinent test results  Airway Mallampati: II TM Distance: >3 FB Neck ROM: full    Dental No notable dental hx. (+) Teeth Intact   Pulmonary neg pulmonary ROS,  breath sounds clear to auscultation  Pulmonary exam normal       Cardiovascular negative cardio ROS  Rhythm:regular Rate:Normal     Neuro/Psych negative neurological ROS  negative psych ROS   GI/Hepatic negative GI ROS, Neg liver ROS,   Endo/Other  negative endocrine ROS  Renal/GU negative Renal ROS  negative genitourinary   Musculoskeletal   Abdominal Normal abdominal exam  (+)   Peds  Hematology negative hematology ROS (+)   Anesthesia Other Findings   Reproductive/Obstetrics (+) Pregnancy                           Anesthesia Physical Anesthesia Plan  ASA: II  Anesthesia Plan: MAC   Post-op Pain Management:    Induction:   Airway Management Planned:   Additional Equipment:   Intra-op Plan:   Post-operative Plan:   Informed Consent: I have reviewed the patients History and Physical, chart, labs and discussed the procedure including the risks, benefits and alternatives for the proposed anesthesia with the patient or authorized representative who has indicated his/her understanding and acceptance.   Dental Advisory Given  Plan Discussed with: Anesthesiologist, CRNA and Surgeon  Anesthesia Plan Comments:        Anesthesia Quick Evaluation

## 2011-08-03 ENCOUNTER — Encounter (HOSPITAL_COMMUNITY): Payer: Self-pay | Admitting: Obstetrics & Gynecology

## 2011-08-16 ENCOUNTER — Inpatient Hospital Stay (HOSPITAL_COMMUNITY)
Admission: AD | Admit: 2011-08-16 | Discharge: 2011-08-16 | Disposition: A | Discharge status: Home / Self Care | Payer: Medicaid Other | Source: Ambulatory Visit | Attending: Obstetrics & Gynecology | Admitting: Obstetrics & Gynecology

## 2012-07-28 ENCOUNTER — Encounter: Payer: Self-pay | Admitting: Certified Nurse Midwife

## 2012-08-03 ENCOUNTER — Ambulatory Visit (INDEPENDENT_AMBULATORY_CARE_PROVIDER_SITE_OTHER): Payer: BC Managed Care – PPO | Admitting: Certified Nurse Midwife

## 2012-08-03 ENCOUNTER — Encounter: Payer: Self-pay | Admitting: Certified Nurse Midwife

## 2012-08-03 VITALS — BP 100/66 | HR 82 | Resp 16 | Ht 64.0 in | Wt 151.0 lb

## 2012-08-03 DIAGNOSIS — Z01419 Encounter for gynecological examination (general) (routine) without abnormal findings: Secondary | ICD-10-CM

## 2012-08-03 DIAGNOSIS — Z0000 Encounter for general adult medical examination without abnormal findings: Secondary | ICD-10-CM

## 2012-08-03 LAB — POCT URINALYSIS DIPSTICK: Urobilinogen, UA: NEGATIVE

## 2012-08-03 NOTE — Patient Instructions (Signed)

## 2012-08-03 NOTE — Progress Notes (Signed)
37 y.o. [id number] Single African American Fe here for annual exam.  Periods normal, but some times heavy, but no issues. Contraception none desired. No partner change, desires STD screening. No health issues today.  Patient's last menstrual period was 07/21/2012.          Sexually active: yes  The current method of family planning is none.    Exercising: yes  walking, jogging, P90X 3x/wk Smoker:  no  Health Maintenance: Pap:  12/14/10 MMG:  none Colonoscopy:  none BMD:   no TDaP:  Not sure  Labs-  Hgb:  13.9    Urine:  negative   reports that she has never smoked. She does not have any smokeless tobacco history on file. She reports that  drinks alcohol. She reports that she does not use illicit drugs.  Past Medical History  Diagnosis Date  . Preterm labor   . Missed abortion current  . Abnormal Pap smear 2009    with colpo/bx + HPV- did f/u  . Chlamydia   . STD (sexually transmitted disease)     Past Surgical History  Procedure Laterality Date  . Dilation and evacuation  08/02/2011    Procedure: DILATATION AND EVACUATION;  Surgeon: Annamaria Boots, MD;  Location: [facility];  Service: Gynecology;  Laterality: N/A;  . Colposcopy  2009  . Cesarean section  1995    emergency -general anesthesia  . Dilation and curettage of uterus  7/13    Current Outpatient Prescriptions  Medication Sig Dispense Refill  . Ascorbic Acid (VITAMIN C PO) Take by mouth.      . Cholecalciferol (VITAMIN D PO) Take by mouth.      Marland Kitchen ibuprofen (ADVIL,MOTRIN) 200 MG tablet Take 200 mg by mouth every 6 (six) hours as needed. For cramping.      . Multiple Vitamins-Minerals (MULTIVITAMIN WITH MINERALS) tablet Take 1 tablet by mouth daily.       No current facility-administered medications for this visit.    Family History  Problem Relation Age of Onset  . Thyroid disease Sister     ROS:  Pertinent items are noted in HPI.  Otherwise, a comprehensive ROS was negative.  Exam:   BP 100/66  Pulse 82   Resp 16  LMP 07/21/2012    Ht Readings from Last 3 Encounters:  08/02/11 5\' 2"  (1.575 m)  08/02/11 5\' 2"  (1.575 m)    General appearance: alert, cooperative and appears stated age Head: Normocephalic, without obvious abnormality, atraumatic Neck: no adenopathy, supple, symmetrical, trachea midline and thyroid normal to inspection and palpation Lungs: clear to auscultation bilaterally Breasts: normal appearance, no masses or tenderness, No nipple retraction or dimpling, No nipple discharge or bleeding, No axillary or supraclavicular adenopathy Heart: regular rate and rhythm Abdomen: soft, non-tender; no masses,  no organomegaly Extremities: extremities normal, atraumatic, no cyanosis or edema Skin: Skin color, texture, turgor normal. No rashes or lesions Lymph nodes: Cervical, supraclavicular, and axillary nodes normal. No abnormal inguinal nodes palpated Neurologic: Grossly normal   Pelvic: External genitalia:  no lesions              Urethra:  normal appearing urethra with no masses, tenderness or lesions              Bartholin's and Skene's: normal                 Vagina: normal appearing vagina with normal color and discharge, no lesions  Cervix: normal, non tender              Pap taken: yes Bimanual Exam:  Uterus:  normal size, contour, position, consistency, mobility, non-tender and anteverted              Adnexa: normal adnexa and no mass, fullness, tenderness               Rectovaginal: Confirms               Anus:  normal sphincter tone, no lesions  A:  Well Woman with normal exam  Contraception none desired  STD screening  P: Reviewed health and wellness pertinent to exam  Discussed importance of Prenatal vitamins daily if not preventing pregnancy. Patient agreeable to start.  Lab: GC,Chlamydia,RPR,HIV    Pap smear as per guidelines   pap smear taken today with HPVHR  counseled on breast self exam, STD prevention, adequate intake of calcium and vitamin  D, diet and exercise  return annually or prn  An After Visit Summary was printed and given to the patient.

## 2012-08-04 LAB — RPR

## 2012-08-04 LAB — HIV ANTIBODY (ROUTINE TESTING W REFLEX): HIV: NONREACTIVE

## 2012-08-06 NOTE — Progress Notes (Signed)
Reviewed CPRomine, MD 

## 2012-08-08 LAB — IPS PAP TEST WITH HPV

## 2012-10-31 ENCOUNTER — Encounter: Payer: Self-pay | Admitting: Certified Nurse Midwife

## 2012-10-31 ENCOUNTER — Ambulatory Visit (INDEPENDENT_AMBULATORY_CARE_PROVIDER_SITE_OTHER): Payer: BC Managed Care – PPO | Admitting: Certified Nurse Midwife

## 2012-10-31 ENCOUNTER — Telehealth: Payer: Self-pay | Admitting: Certified Nurse Midwife

## 2012-10-31 VITALS — BP 90/62 | HR 68 | Resp 16 | Ht 63.25 in | Wt 152.0 lb

## 2012-10-31 DIAGNOSIS — Z202 Contact with and (suspected) exposure to infections with a predominantly sexual mode of transmission: Secondary | ICD-10-CM

## 2012-10-31 DIAGNOSIS — N949 Unspecified condition associated with female genital organs and menstrual cycle: Secondary | ICD-10-CM

## 2012-10-31 DIAGNOSIS — N925 Other specified irregular menstruation: Secondary | ICD-10-CM

## 2012-10-31 LAB — CBC WITH DIFFERENTIAL/PLATELET
Basophils Absolute: 0 10*3/uL (ref 0.0–0.1)
Basophils Relative: 0 % (ref 0–1)
HCT: 36.3 % (ref 36.0–46.0)
Lymphocytes Relative: 32 % (ref 12–46)
Neutro Abs: 4.8 10*3/uL (ref 1.7–7.7)
Neutrophils Relative %: 58 % (ref 43–77)
Platelets: 201 10*3/uL (ref 150–400)
RDW: 14.6 % (ref 11.5–15.5)
WBC: 8.2 10*3/uL (ref 4.0–10.5)

## 2012-10-31 LAB — TSH: TSH: 0.516 u[IU]/mL (ref 0.350–4.500)

## 2012-10-31 NOTE — Telephone Encounter (Signed)
Spoke with patient. She currently is not on any contraception.  Had period 9/10 and now having 3 days of heavy bleeding. Also having headaches that are not relived by Aleve.  OV scheduled.

## 2012-10-31 NOTE — Telephone Encounter (Signed)
Patient had a period on the 10 of Sept it lasted 5 days. She started bleeding again 3 ago and it is very heavy and she is having really bad cramps and headaches.

## 2012-10-31 NOTE — Progress Notes (Signed)
37 y.o. Single African American female [id number] with LMP September 10,2014 here complaining of bleeding on day 17 of cycle with heavy bleeding at onset and has continued to be slightly heavy and decreased  this afternoon . Patient took 3 Aleve to help with headache which is better now and cramping as subsided. No contraceptive use. Last sexual activity approximately 10-17-12. No STD concerns, but no condom use. Denies pelvic pain, just "doesn/t understand bleeding occurring at not period time". Previous periods prior to 10-11-12 normal, 28 day cycle. No other health issues, other than fatigue.  No weight change per patient. Patient eating well and drinking fluids. Missed work the past two days.  POCT UPT: negative  O: Healthy WN WD female   Alert oriented x3 Skin:warm and dry Thyroid:normal to inspection and palpation Pelvic exam: External genital area normal no lesions, BUS negative Vagina: Scant amount of red blood noted in vaginal vault Cervix: non tender, negative CMT, Specimen obtained Uterus: normal, non tender  Adnexa: normal, no masses, non tender Perineal area: no lesions  A:Normal Pelvic exam 2-DUB ? etioloy 3-STD screening  P:Reviewed findings of normal pelvic exam 2-Discussed bleeding can occur with weight change, thyroid change or just occurs with any abnormal finding. Discussed OCP use to encourage cycle control if occurs again. Patient will consider. Encouraged condom use. Patient will keep calendar of bleeding occurrence if occurs again. Discussed NSAID use and should not use 3 at a time due to GI concern for bleeding risk. Warning signs of excessive bleeding given and need to advise. Lab: CBC with diff., TSH Lab GC, Chlamydia  RV prn, patient to call with status in 1-2 days.

## 2012-11-02 LAB — IPS N GONORRHOEA AND CHLAMYDIA BY PCR

## 2012-11-02 NOTE — Progress Notes (Signed)
Note reviewed, agree with plan.  Reynaldo Rossman, MD  

## 2012-12-07 ENCOUNTER — Other Ambulatory Visit: Payer: Self-pay

## 2012-12-27 IMAGING — US US OB COMP LESS 14 WK
1 series · 13 of 28 positions shown · non-contrast
Comparison: none

[Series 1: us ob comp less 14 wks · 13 of 74 slices shown]
[im 3/74]
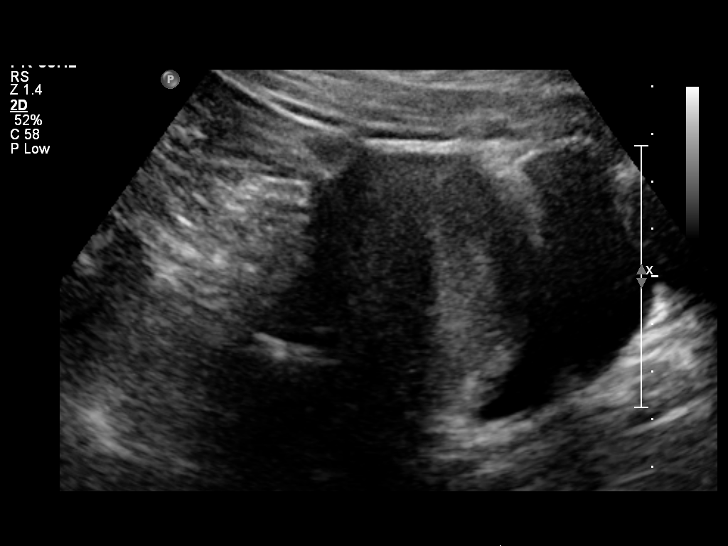
[im 9/74]
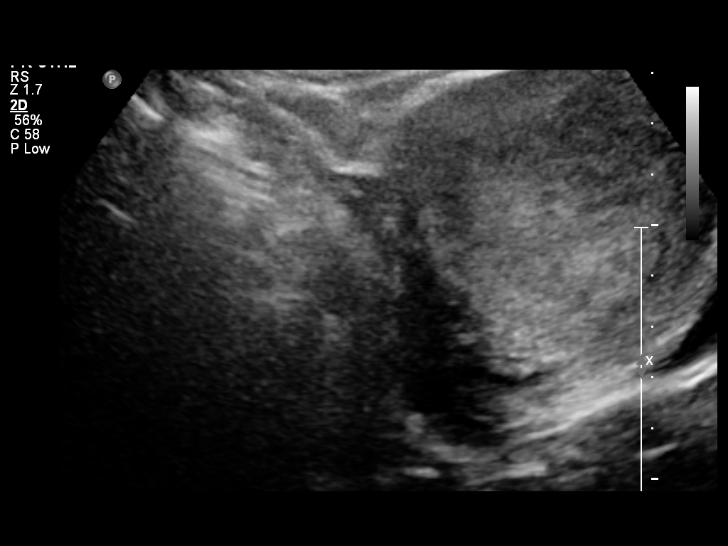
[im 14/74]
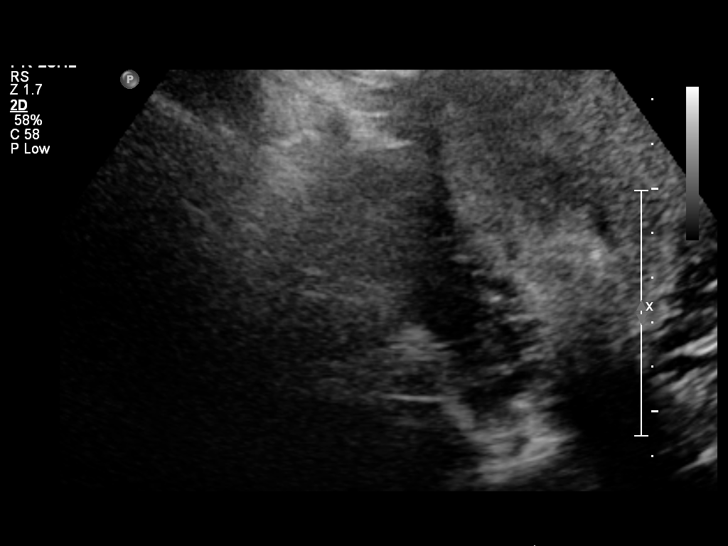
[im 19/74]
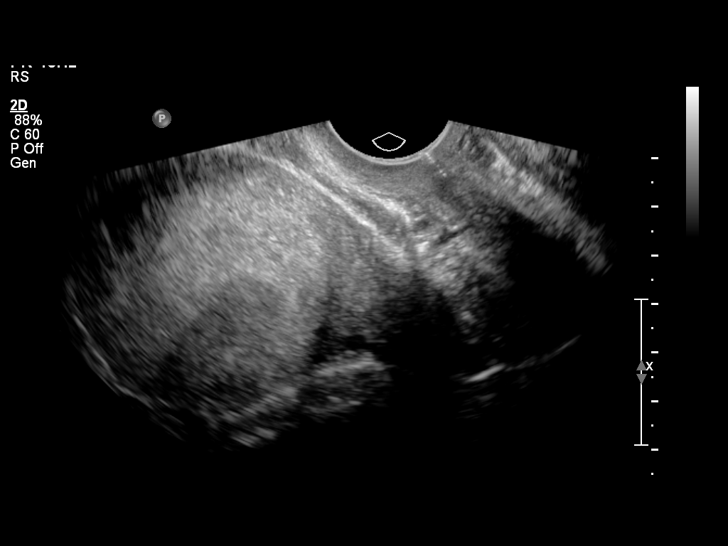
[im 25/74]
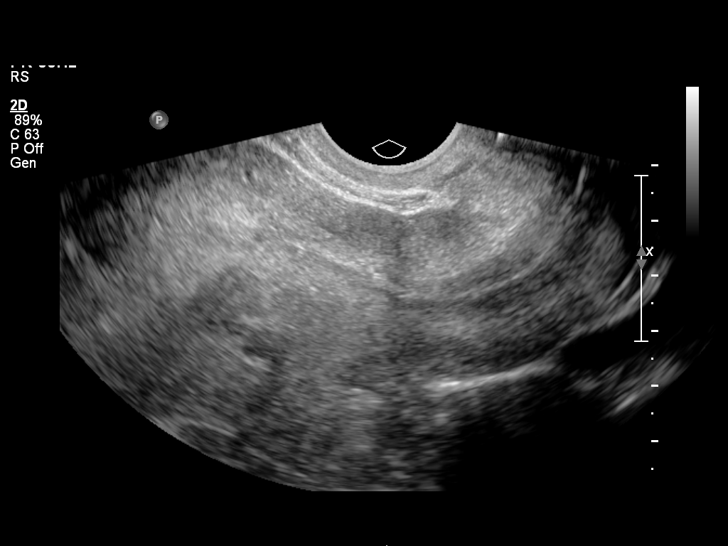
[im 30/74]
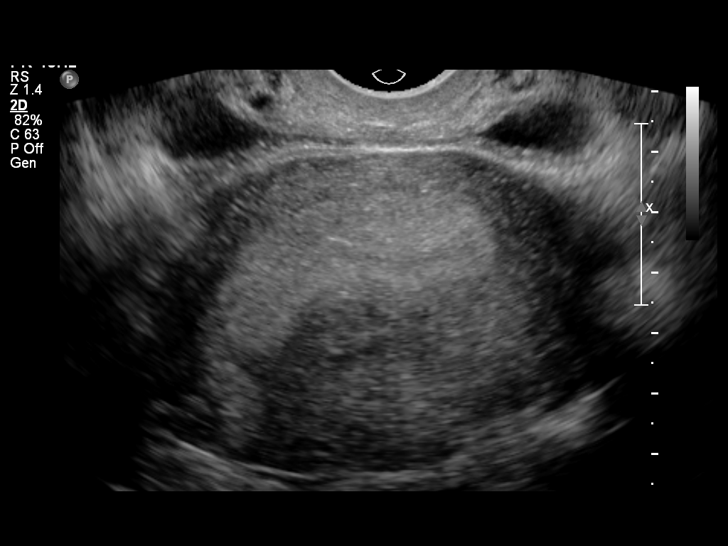
[im 38/74]
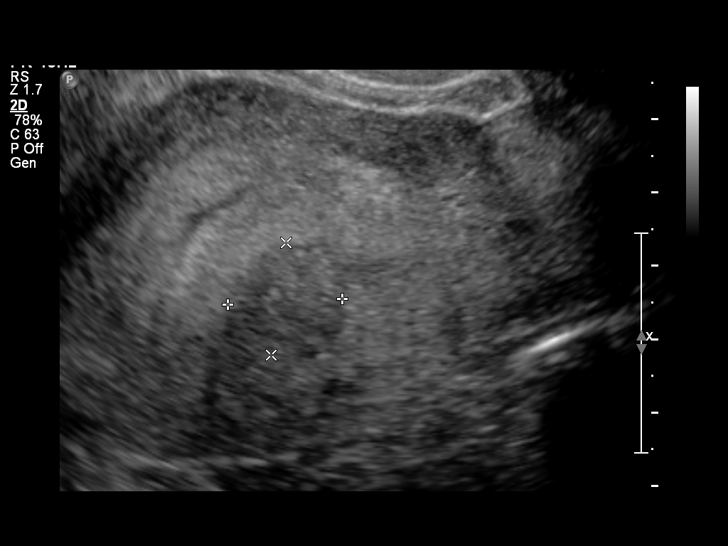
[im 44/74]
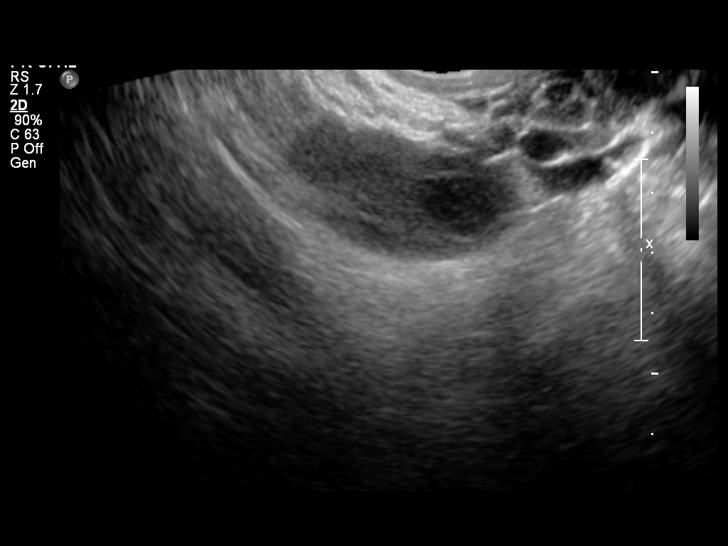
[im 49/74]
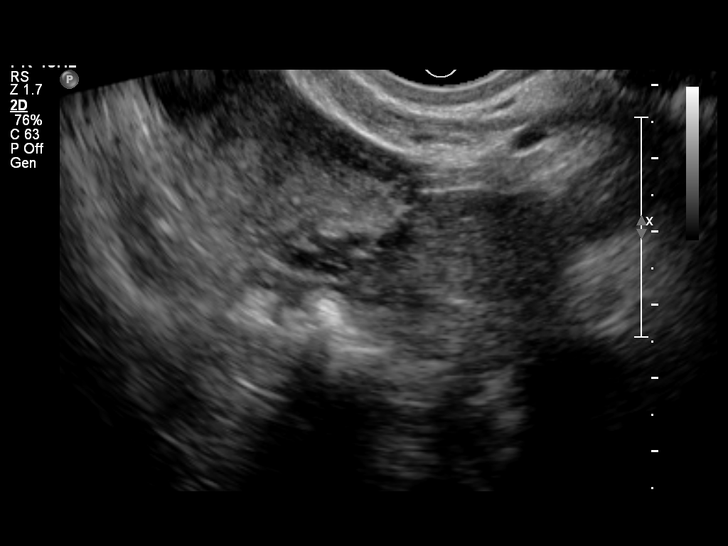
[im 55/74]
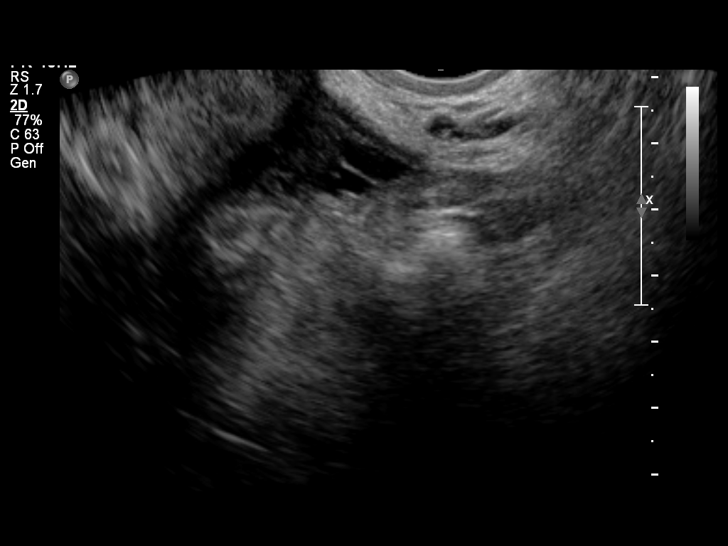
[im 60/74]
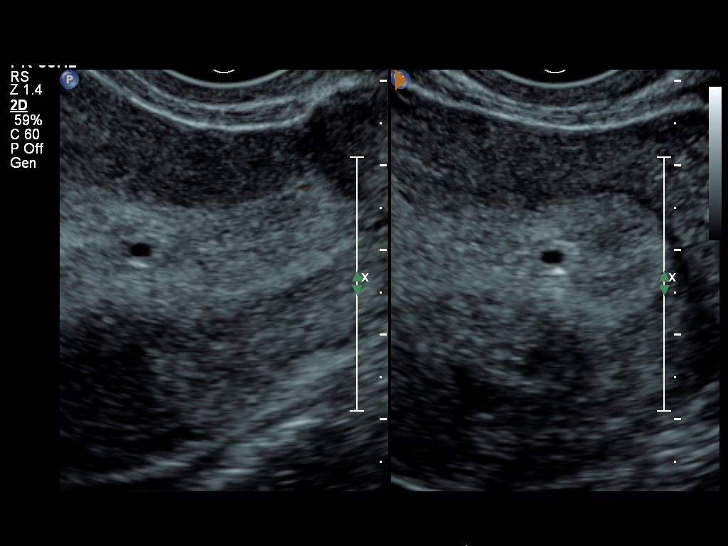
[im 65/74]
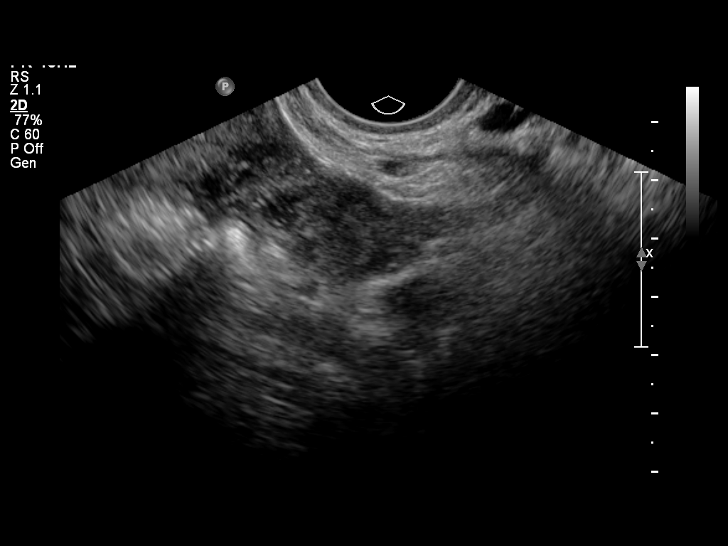
[im 71/74]
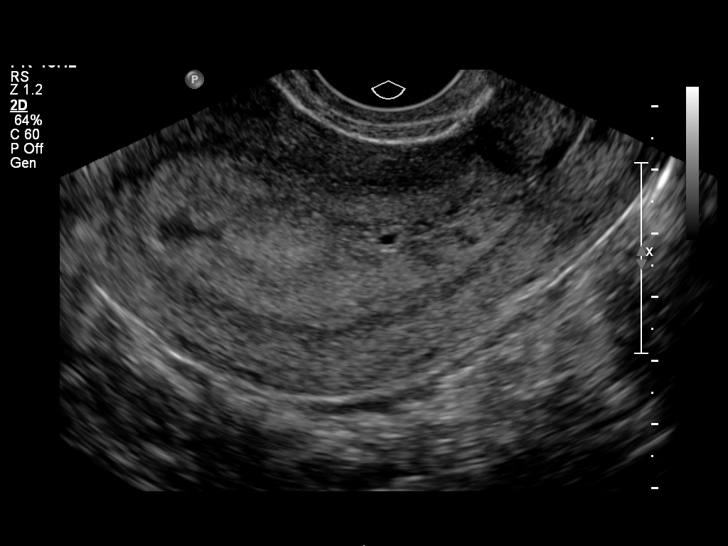

[13 of 28 positions shown; findings below may reference images not displayed]

OBSTETRICS REPORT
                      (Signed Final 07/16/2011 [DATE])

Procedures

 US OB COMP LESS 14 WKS                                76801.0
Indications

 Pelvic / Abdominal pain
 Poor obstetric history: Previous IUFD @ 26 weeks
 History of polyp (no surgical removal)
 Previous cesarean section @ 26 weeks
 (secondary to PPROM)
 Pregnancy with inconclusive fetal viability
Fetal Evaluation

 Gest. Sac:         ? early IUGS vs.
                    pseudosac
 Cardiac Activity:  No embryo visualized

 Comment:    BHCG today is 942.
Biometry

 GS:       2.4  mm    G. Age:   N/A                    EDD:
Cervix Uterus Adnexa

 Cervix:       Closed.
 Uterus:       Echogenic area ? polyp measuring 2.5 x 1 x 1.3cm.
               Posterior submusocal fibroid measures 1.6 x 1.6 x
               cm.
 Cul De Sac:   Small amount of free fluid seen.

 Left Ovary:   Within normal limits.
 Right Ovary:  Small corpus luteum noted.
 Adnexa:     No abnormality visualized.
Impression

 Tiny intrauterine fluid collection is likely an early intrauterine
 gestational sac. A pseudosac secondary to an occult ectopic
 pregnancy cannot be excluded.
 Echogenic, focal thickening in the endometrium. This may be
 an endometrial polyp (the patient has a reported history of
 endometrial polyp).
 2.2 cm submucosal fibroid in the posterior uterine body.
Recommendations

 Suggest follow-up ultrasound in 2 weeks to evaluate for
 expected progression of an intrauterine pregnancy.

## 2013-02-22 ENCOUNTER — Ambulatory Visit: Payer: BC Managed Care – PPO | Admitting: Family Medicine

## 2013-02-22 VITALS — BP 118/72 | HR 82 | Temp 98.6°F | Resp 16 | Ht 63.0 in | Wt 147.2 lb

## 2013-02-22 DIAGNOSIS — H1032 Unspecified acute conjunctivitis, left eye: Secondary | ICD-10-CM

## 2013-02-22 DIAGNOSIS — H1030 Unspecified acute conjunctivitis, unspecified eye: Secondary | ICD-10-CM

## 2013-02-22 DIAGNOSIS — J322 Chronic ethmoidal sinusitis: Secondary | ICD-10-CM

## 2013-02-22 MED ORDER — PSEUDOEPHEDRINE HCL ER 120 MG PO TB12: 120.0000 mg | ORAL_TABLET | Freq: Two times a day (BID) | ORAL | Status: DC

## 2013-02-22 MED ORDER — METHYLPREDNISOLONE ACETATE 80 MG/ML IJ SUSP
80.0000 mg | Freq: Once | INTRAMUSCULAR | Status: AC
  Administered 2013-02-22: 80 mg via INTRAMUSCULAR

## 2013-02-22 MED ORDER — IPRATROPIUM BROMIDE 0.03 % NA SOLN: 2.0000 | Freq: Four times a day (QID) | NASAL | Status: DC

## 2013-02-22 MED ORDER — FLUTICASONE PROPIONATE 50 MCG/ACT NA SUSP: 2.0000 | Freq: Every day | NASAL | Status: DC

## 2013-02-22 MED ORDER — AMOXICILLIN-POT CLAVULANATE 875-125 MG PO TABS: 1.0000 | ORAL_TABLET | Freq: Two times a day (BID) | ORAL | Status: DC

## 2013-02-22 MED ORDER — HYDROCOD POLST-CHLORPHEN POLST 10-8 MG/5ML PO LQCR: 5.0000 mL | Freq: Two times a day (BID) | ORAL | Status: DC | PRN

## 2013-02-22 NOTE — Progress Notes (Signed)
Subjective:    Patient ID: Anne Reynolds, female    DOB: 06/30/1975, 38 y.o.   MRN: [id number] Chief Complaint  Patient presents with  . congestion    x 2 weeks   . Dizziness  . eye redness    left eye , itchying with discharge more so in the morning     HPI This chart was scribed for Anne Reynolds by Smiley HousemanFallon Davis, Scribe. This patient was seen in room 11 and the patient's care was started at 10:25 AM.  HPI Comments: Anne Reynolds is a 38 y.o. female who presents to the [facility] complaining of constant gradually worsening URI symptoms that started about 2 weeks ago.  Pt states it began as a sore throat with associated nasal congestion, HA, and a productive cough.  Pt states that her cough is productive of greenish sputum and has kept her up at night.  She reports her HA is located on the top of her head and frontal area.  Pt denies chest pain, SOB, urine or bowel changes.  She is unsure of fever, but states she has had chills.  She states that yesterday was the first day she regained her appetite as she was nauseas for several days prior.  Pt states she has tried OTC medication including DayQuil tablets and liquid as well as Mucinex.  She also complains of left eye redness that started about 3 days ago.  She states she has green pus-like discharge from her eye which crusts in the morning. She has a little photophobia and it feels scratchy when she blinks. Usu wears contacts but has switched to glasses over the past few d.  She was around her cousin this weekend who had pink eye.  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  No vision changes in her left eye. Has not used any eye gtts other than Murine.  Past Surgical History  Procedure Laterality Date  . Dilation and evacuation  08/02/2011    Procedure: DILATATION AND EVACUATION;  Surgeon: Annamaria BootsMary Suzanne  Miller, MD;  Location: [facility];  Service: Gynecology;  Laterality: N/A;  . Colposcopy  2009  . Cesarean section  1995    emergency -general anesthesia  . Dilation and curettage of uterus  7/13    Family History  Problem Relation Age of Onset  . Thyroid disease Sister     History   Social History  . Marital Status: Single    Spouse Name: N/A    Number of Children: N/A  . Years of Education: N/A   Occupational History  . Not on file.   Social History Main Topics  . Smoking status: Never Smoker   . Smokeless tobacco: Not on file  . Alcohol Use: Yes     Comment: social not pregnant  . Drug Use: No  . Sexual Activity: Yes    Partners: Male    Birth Control/ Protection: None   Other Topics Concern  . Not on file   Social History Narrative  . No narrative on file    Allergies  Allergen Reactions  . Latex Hives    There are no active problems to display for this patient.  Review of Systems  Constitutional: Positive for chills, activity change, appetite change and fatigue. Negative for fever.  HENT: Positive for congestion, postnasal drip, rhinorrhea, sinus pressure and  sore throat.   Eyes: Positive for photophobia, pain, discharge and redness. Negative for itching and visual disturbance.  Respiratory: Positive for cough. Negative for chest tightness and shortness of breath.   Cardiovascular: Negative for chest pain.  Gastrointestinal: Positive for nausea. Negative for vomiting, abdominal pain and diarrhea.  Genitourinary: Negative for dysuria and decreased urine volume.  Musculoskeletal: Negative for back pain.  Skin: Negative for color change and rash.  Neurological: Positive for dizziness and headaches. Negative for syncope.    Triage Vitals: BP 118/72  Pulse 82  Temp(Src) 98.6 F (37 C) (Oral)  Resp 16  Ht 5\' 3"  (1.6 m)  Wt 147 lb 3.2 oz (66.769 kg)  BMI 26.08 kg/m2  SpO2 100%  LMP 02/11/2013 Objective:   Physical Exam  Nursing note and vitals  reviewed. Constitutional: She is oriented to person, place, and time. She appears well-developed and well-nourished. No distress.  HENT:  Head: Normocephalic and atraumatic.  Right Ear: Tympanic membrane, external ear and ear canal normal.  Left Ear: Tympanic membrane, external ear and ear canal normal.  Nose: Mucosal edema present.  Mouth/Throat: Uvula is midline and mucous membranes are normal. Posterior oropharyngeal erythema present. No oropharyngeal exudate or posterior oropharyngeal edema.  Right ear cerumen and impaction.  Eyes: EOM are normal. Pupils are equal, round, and reactive to light. Right eye exhibits no chemosis, no discharge and no exudate. Left eye exhibits chemosis and discharge. Left eye exhibits no exudate. Right conjunctiva is not injected. Right conjunctiva has no hemorrhage. Left conjunctiva is injected (mildly). Left conjunctiva has no hemorrhage.  Fundoscopic exam:      The right eye shows no exudate, no hemorrhage and no papilledema.       The left eye shows no exudate, no hemorrhage and no papilledema.  Fundoscopic exam same bilaterally - no acute abnormality but difficult to complete  Neck: Trachea normal. Neck supple. No tracheal deviation present. No thyromegaly present.  Cardiovascular: Normal rate, regular rhythm, S1 normal, S2 normal and normal heart sounds.  Exam reveals no gallop.   No murmur heard. Pulmonary/Chest: Effort normal and breath sounds normal. No respiratory distress. She has no wheezes. She has no rales.  Musculoskeletal: Normal range of motion.  Lymphadenopathy:       Head (right side): No submandibular, no tonsillar, no preauricular, no posterior auricular and no occipital adenopathy present.       Head (left side): No submandibular, no tonsillar, no preauricular, no posterior auricular and no occipital adenopathy present.    She has cervical adenopathy.       Right cervical: Superficial cervical adenopathy present.       Left cervical:  Superficial cervical adenopathy present.       Right: No supraclavicular adenopathy present.       Left: No supraclavicular adenopathy present.  Neurological: She is alert and oriented to person, place, and time.  Skin: Skin is warm and dry.  Psychiatric: She has a normal mood and affect. Her behavior is normal.    Visual Acuity Screening   Right eye Left eye Both eyes  Without correction:     With correction: 20/200 20/20 20/20      Assessment & Plan:  Conjunctivitis, acute, left eye - warm compresses, saline gtts. Suspect sxs will resolve since she will be on oral antibiotics but if not improving, will be happy to call in antibiotic eye gtt. If vision changes or worsens or develops more pain -> RTC for further eval.  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 Wear glasses till resolved then use new contacts. Reviewed hand hygiene.  Ethmoid sinusitis - Plan: methylPREDNISolone acetate (DEPO-MEDROL) injection 80 mg - will cover w/ augmentin since she has been ill for > 2 wks but need to start decongestants and nasal steroids to decrease sinus congestion - use netti pot and humidifier.  Discussed hand hygiene in detail.   Meds ordered this encounter  Medications  . amoxicillin-clavulanate (AUGMENTIN) 875-125 MG per tablet    Sig: Take 1 tablet by mouth 2 (two) times daily.    Dispense:  20 tablet    Refill:  0  . chlorpheniramine-HYDROcodone (TUSSIONEX PENNKINETIC ER) 10-8 MG/5ML LQCR    Sig: Take 5 mLs by mouth every 12 (twelve) hours as needed.    Dispense:  120 mL    Refill:  0  . methylPREDNISolone acetate (DEPO-MEDROL) injection 80 mg    Sig:   . fluticasone (FLONASE) 50 MCG/ACT nasal spray    Sig: Place 2 sprays into both nostrils at bedtime.    Dispense:  16 g    Refill:  2  . ipratropium (ATROVENT) 0.03 % nasal spray    Sig: Place 2 sprays into the nose 4 (four) times daily.    Dispense:  30 mL    Refill:  1  . pseudoephedrine (SUDAFED 12 HOUR) 120  MG 12 hr tablet    Sig: Take 1 tablet (120 mg total) by mouth 2 (two) times daily.    Dispense:  30 tablet    Refill:  0    I personally performed the services described in this documentation, which was scribed in my presence. The recorded information has been reviewed and considered, and addended by me as needed.  Norberto Sorenson, MD MPH

## 2013-02-22 NOTE — Patient Instructions (Addendum)
Hot showers or breathing in steam may help loosen the congestion.  Using a netti pot or sinus rinse is also likely to help you feel better and keep this from progressing.  Use the atrovent nasal spray as needed throughout the day and use the fluticasone nasal spray every night before bed for at least 2 weeks.  I recommend augmenting with 12 hr sudafed (behind the counter) and generic mucinex to help you move out the congestion.  If no improvement or you are getting worse, come back as you might need a full course of steroids but hopefully with all of the above, you can avoid it. Use warm compresses to your eye every 2-3 hours to prevent build up and congestion.   Conjunctivitis Conjunctivitis is commonly called "pink eye." Conjunctivitis can be caused by bacterial or viral infection, allergies, or injuries. There is usually redness of the lining of the eye, itching, discomfort, and sometimes discharge. There may be deposits of matter along the eyelids. A viral infection usually causes a watery discharge, while a bacterial infection causes a yellowish, thick discharge. Pink eye is very contagious and spreads by direct contact. You may be given antibiotic eyedrops as part of your treatment. Before using your eye medicine, remove all drainage from the eye by washing gently with warm water and cotton balls. Continue to use the medication until you have awakened 2 mornings in a row without discharge from the eye. Do not rub your eye. This increases the irritation and helps spread infection. Use separate towels from other household members. Wash your hands with soap and water before and after touching your eyes. Use cold compresses to reduce pain and sunglasses to relieve irritation from light. Do not wear contact lenses or wear eye makeup until the infection is gone. SEEK MEDICAL CARE IF:   Your symptoms are not better after 3 days of treatment.  You have increased pain or trouble seeing.  The outer eyelids  become very red or swollen. Document Released: 02/26/2004 Document Revised: 04/12/2011 Document Reviewed: 01/18/2005 [facility] Patient Information 2014 [place], [place].

## 2013-03-06 ENCOUNTER — Telehealth: Payer: Self-pay

## 2013-03-06 MED ORDER — FLUCONAZOLE 150 MG PO TABS: 150.0000 mg | ORAL_TABLET | Freq: Once | ORAL | Status: DC

## 2013-03-06 NOTE — Telephone Encounter (Signed)
Patient states that the Amoxicillin she was prescribed has caused her to have a yeast infection. She would like something to treat that issue. [facility] [address]  [phone]

## 2013-03-06 NOTE — Telephone Encounter (Signed)
Sent Rx and notified pt.

## 2013-10-04 ENCOUNTER — Telehealth: Payer: Self-pay | Admitting: Certified Nurse Midwife

## 2013-10-04 ENCOUNTER — Encounter: Payer: Self-pay | Admitting: *Deleted

## 2013-10-04 ENCOUNTER — Ambulatory Visit (INDEPENDENT_AMBULATORY_CARE_PROVIDER_SITE_OTHER): Payer: BC Managed Care – PPO | Admitting: Emergency Medicine

## 2013-10-04 VITALS — BP 118/80 | HR 64 | Temp 97.7°F | Resp 18 | Ht 63.5 in | Wt 134.4 lb

## 2013-10-04 DIAGNOSIS — G43109 Migraine with aura, not intractable, without status migrainosus: Secondary | ICD-10-CM

## 2013-10-04 MED ORDER — ELETRIPTAN HYDROBROMIDE 40 MG PO TABS: 40.0000 mg | ORAL_TABLET | ORAL | Status: DC | PRN

## 2013-10-04 NOTE — Progress Notes (Signed)
[facility] [address], [place] [address] [phone]  Date:  10/04/2013   Name:  Anne Reynolds   DOB:  05-04-1975   MRN:  [id number]  PCP:  No PCP Per Patient    Chief Complaint: Migraine   History of Present Illness:  Anne Reynolds is a 38 y.o. very pleasant female patient who presents with the following:  History of bimonthly migraines.  Usually responds t OTC medications.  Has a prolonged headache for the past three days in frontal and bitemporal area.  Nauseated and vomiting.  Photophobic.   No antecedent illness or injury.  No fever ro chils.  No cough or coryza.   No neuro or visual symptoms.   No improvement with over the counter medications or other home remedies. Denies other complaint or health concern today.   There are no active problems to display for this patient.   Past Medical History  Diagnosis Date  . Preterm labor   . Missed abortion current  . Abnormal Pap smear 2009    with colpo/bx + HPV- did f/u  . Chlamydia   . STD (sexually transmitted disease)     Past Surgical History  Procedure Laterality Date  . Dilation and evacuation  08/02/2011    Procedure: DILATATION AND EVACUATION;  Surgeon: Annamaria Boots, MD;  Location: [facility];  Service: Gynecology;  Laterality: N/A;  . Colposcopy  2009  . Cesarean section  1995    emergency -general anesthesia  . Dilation and curettage of uterus  7/13    History  Substance Use Topics  . Smoking status: Never Smoker   . Smokeless tobacco: Not on file  . Alcohol Use: Yes     Comment: social not pregnant    Family History  Problem Relation Age of Onset  . Thyroid disease Sister     Allergies  Allergen Reactions  . Latex Hives    Medication list has been reviewed and updated.  Current Outpatient Prescriptions on File Prior to Visit  Medication Sig Dispense Refill  . Multiple Vitamins-Minerals (MULTIVITAMIN WITH MINERALS) tablet Take 1 tablet by mouth daily.       . Ascorbic Acid (VITAMIN C PO) Take by mouth as needed.       . chlorpheniramine-HYDROcodone (TUSSIONEX PENNKINETIC ER) 10-8 MG/5ML LQCR Take 5 mLs by mouth every 12 (twelve) hours as needed.  120 mL  0  . Cholecalciferol (VITAMIN D PO) Take by mouth as needed.       . fluconazole (DIFLUCAN) 150 MG tablet Take 1 tablet (150 mg total) by mouth once.  1 tablet  0  . fluticasone (FLONASE) 50 MCG/ACT nasal spray Place 2 sprays into both nostrils at bedtime.  16 g  2  . ipratropium (ATROVENT) 0.03 % nasal spray Place 2 sprays into the nose 4 (four) times daily.  30 mL  1  . pseudoephedrine (SUDAFED 12 HOUR) 120 MG 12 hr tablet Take 1 tablet (120 mg total) by mouth 2 (two) times daily.  30 tablet  0   No current facility-administered medications on file prior to visit.    Review of Systems:  As per HPI, otherwise negative.    Physical Examination: Filed Vitals:   10/04/13 1340  BP: 118/80  Pulse: 64  Temp: 97.7 F (36.5 C)  Resp: 18   Filed Vitals:   10/04/13 1340  Height: 5' 3.5" (1.613 m)  Weight: 134 lb 6.4 oz (60.963 kg)   Body  mass index is 23.43 kg/(m^2). Ideal Body Weight: Weight in (lb) to have BMI = 25: 143.1  GEN: WDWN, NAD, Non-toxic, A & O x 3 HEENT: Atraumatic, Normocephalic. Neck supple. No masses, No LAD. Ears and Nose: No external deformity. CV: RRR, No M/G/R. No JVD. No thrill. No extra heart sounds. PULM: CTA B, no wheezes, crackles, rhonchi. No retractions. No resp. distress. No accessory muscle use. ABD: S, NT, ND, +BS. No rebound. No HSM. EXTR: No c/c/e NEURO Normal gait.  PSYCH: Normally interactive. Conversant. Not depressed or anxious appearing.  Calm demeanor     Assessment and Plan:  Migraine relpax   Signed,  Phillips Odor, MD

## 2013-10-04 NOTE — Patient Instructions (Signed)

## 2013-10-04 NOTE — Telephone Encounter (Signed)
error 

## 2013-10-05 ENCOUNTER — Inpatient Hospital Stay (HOSPITAL_COMMUNITY)
Admission: AD | Admit: 2013-10-05 | Discharge: 2013-10-05 | Disposition: A | Discharge status: Home / Self Care | Payer: BC Managed Care – PPO | Source: Ambulatory Visit | Attending: Obstetrics & Gynecology | Admitting: Obstetrics & Gynecology

## 2013-10-05 ENCOUNTER — Encounter (HOSPITAL_COMMUNITY): Payer: Self-pay | Admitting: Advanced Practice Midwife

## 2013-10-05 DIAGNOSIS — IMO0002 Reserved for concepts with insufficient information to code with codable children: Secondary | ICD-10-CM

## 2013-10-05 DIAGNOSIS — N949 Unspecified condition associated with female genital organs and menstrual cycle: Secondary | ICD-10-CM | POA: Diagnosis not present

## 2013-10-05 DIAGNOSIS — T192XXA Foreign body in vulva and vagina, initial encounter: Secondary | ICD-10-CM

## 2013-10-05 DIAGNOSIS — F17200 Nicotine dependence, unspecified, uncomplicated: Secondary | ICD-10-CM | POA: Insufficient documentation

## 2013-10-05 NOTE — Discharge Instructions (Signed)
Vaginal Foreign Body °A vaginal foreign body is any object that gets stuck or left inside the vagina. Vaginal foreign bodies left in the vagina for a long time can cause irritation and infection. In most cases, symptoms go away once the vaginal foreign body is found and removed. Rarely, a foreign object can break through the walls of the vagina and cause a serious infection inside the abdomen.  °CAUSES  °The most common vaginal foreign bodies are: °· Tampons. °· Contraceptive devices. °· Toilet tissue left in the vagina. °· Small objects that were placed in the vagina out of curiosity and got stuck. °· A result of sexual abuse.  °SIGNS AND SYMPTOMS °· Light vaginal bleeding. °· Blood-tinged vaginal fluid (discharge). °· Vaginal discharge that smells bad. °· Vaginal itching or burning. °· Redness, swelling, or rash near the opening of the vagina. °· Abdominal pain. °· Fever. °· Burning or frequent urination. °DIAGNOSIS  °Your health care provider may be able to diagnose a vaginal foreign body based on the information you provide, your symptoms, and a physical exam. Your health care provider may also perform the following tests to check for infection: °· A swab of the discharge to check under a microscope for bacteria (culture). °· A urine culture. °· An examination of the vagina with a small, lighted scope (vaginoscopy). °· Imaging tests to get a picture of the inside of your vagina, such as: °¨ Ultrasound. °¨ X-ray. °¨ MRI. °TREATMENT  °In most cases, a vaginal foreign body can be easily removed and the symptoms usually go away very quickly. Other treatment may include:  °· If the vaginal foreign body is not easily removed, medicine may be given to make you go to sleep (general anesthesia) to have the object removed. °· Emergency surgery may be necessary if an infection spreads through the walls of the vagina into the abdomen (acute abdomen). This is rare. °· You may need to take antibiotic medicine if you have a  vaginal or urinary tract infection. °HOME CARE INSTRUCTIONS  °· Take medicines only as directed by your health care provider. °· If you were prescribed an antibiotic medicine, finish it all even if you start to feel better. °· Do not have sex or use tampons until your health care provider says it is okay. °· Do not douche or use vaginal rinses unless your health care provider recommends it. °· Keep all follow-up visits as directed by your health care provider. This is important. °SEEK MEDICAL CARE IF: °· You have abdominal pain or burning pain when urinating. °· You have a fever. °SEEK IMMEDIATE MEDICAL CARE IF: °· You have heavy vaginal bleeding or discharge.   °· You have very bad abdominal pain.   °MAKE SURE YOU: °· Understand these instructions. °· Will watch your condition. °· Will get help right away if you are not doing well or get worse. °Document Released: 06/04/2013 Document Reviewed: 11/17/2012 °ExitCare® Patient Information ©2015 ExitCare, LLC. This information is not intended to replace advice given to you by your health care provider. Make sure you discuss any questions you have with your health care provider. ° °

## 2013-10-05 NOTE — MAU Note (Signed)
Pt reports her period ended 3 days ago, has vaginal odor and discharge and thinks a tampon is still in.

## 2013-10-05 NOTE — MAU Provider Note (Signed)
History     CSN: [id number]  Arrival date and time: 10/05/13 0046   First Provider Initiated Contact with Patient 10/05/13 0126      No chief complaint on file.  HPI This is a 38 y.o. female who presents with report of retained tampon, for 3 days. Tried to get it out but could not. No other complaints.  RN Note:   Pt reports her period ended 3 days ago, has vaginal odor and discharge and thinks a tampon is still in.       OB History   Grav Para Term Preterm Abortions TAB SAB Ect Mult Living        Past Medical History  Diagnosis Date  . Preterm labor   . Missed abortion current  . Abnormal Pap smear 2009    with colpo/bx + HPV- did f/u  . Chlamydia   . STD (sexually transmitted disease)     Past Surgical History  Procedure Laterality Date  . Dilation and evacuation  08/02/2011    Procedure: DILATATION AND EVACUATION;  Surgeon: Annamaria Boots, MD;  Location: [facility];  Service: Gynecology;  Laterality: N/A;  . Colposcopy  2009  . Cesarean section  1995    emergency -general anesthesia  . Dilation and curettage of uterus  7/13    Family History  Problem Relation Age of Onset  . Thyroid disease Sister     History  Substance Use Topics  . Smoking status: Current Every Day Smoker -- 0.25 packs/day  . Smokeless tobacco: Never Used  . Alcohol Use: Yes     Comment: social not pregnant    Allergies:  Allergies  Allergen Reactions  . Latex Hives    Prescriptions prior to admission  Medication Sig Dispense Refill  . Ascorbic Acid (VITAMIN C PO) Take by mouth as needed.       . chlorpheniramine-HYDROcodone (TUSSIONEX PENNKINETIC ER) 10-8 MG/5ML LQCR Take 5 mLs by mouth every 12 (twelve) hours as needed.  120 mL  0  . Cholecalciferol (VITAMIN D PO) Take by mouth as needed.       . eletriptan (RELPAX) 40 MG tablet Take 1 tablet (40 mg total) by mouth as needed for migraine or headache. One tablet by mouth at onset of headache. May repeat  in 2 hours if headache persists or recurs.  10 tablet  12  . fluconazole (DIFLUCAN) 150 MG tablet Take 1 tablet (150 mg total) by mouth once.  1 tablet  0  . fluticasone (FLONASE) 50 MCG/ACT nasal spray Place 2 sprays into both nostrils at bedtime.  16 g  2  . ipratropium (ATROVENT) 0.03 % nasal spray Place 2 sprays into the nose 4 (four) times daily.  30 mL  1  . Multiple Vitamins-Minerals (MULTIVITAMIN WITH MINERALS) tablet Take 1 tablet by mouth daily.      . pseudoephedrine (SUDAFED 12 HOUR) 120 MG 12 hr tablet Take 1 tablet (120 mg total) by mouth 2 (two) times daily.  30 tablet  0    Review of Systems  Constitutional: Negative for fever, chills and malaise/fatigue.  Gastrointestinal: Negative for nausea, vomiting and abdominal pain.  Neurological: Negative for weakness.   Physical Exam   Blood pressure 113/78, pulse 86, temperature 98.7 F (37.1 C), temperature source Oral, resp. rate 18, height  (1.575 m), weight 61.689 kg (136 lb), last menstrual period 09/25/2013, SpO2 100.00%.  Physical Exam  Constitutional: She is  oriented to person, place, and time. She appears well-developed and well-nourished. No distress.  Cardiovascular: Normal rate.   Respiratory: Effort normal.  GI: Soft. She exhibits no distension. There is no tenderness. There is no rebound and no guarding.  Genitourinary: Vagina normal. No vaginal discharge (minimal) found.  Retained tampon in right posterior fornix   Musculoskeletal: Normal range of motion.  Neurological: She is alert and oriented to person, place, and time.  Skin: Skin is warm and dry.  Psychiatric: She has a normal mood and affect.    MAU Course  Procedures  MDM Tampon removed easily and atraumatically  Assessment and Plan  A:  Retained tampon P:  Removed tampon       Reassured no need for antibiotics.       Followup with your doctor  [facility] 10/05/2013, 1:26 AM

## 2013-10-22 ENCOUNTER — Telehealth: Payer: Self-pay

## 2013-10-22 NOTE — Telephone Encounter (Signed)
Pt saw Dr. Dareen Piano For Migraines, and was prescribed     eletriptan (RELPAX) 40 MG tablet [[id number]]     , but it is too expensive, would like to know if there is something more generic she can be prescribed?

## 2013-10-23 ENCOUNTER — Other Ambulatory Visit: Payer: Self-pay | Admitting: Emergency Medicine

## 2013-10-23 MED ORDER — SUMATRIPTAN SUCCINATE 100 MG PO TABS: 100.0000 mg | ORAL_TABLET | Freq: Once | ORAL | Status: AC

## 2013-10-23 NOTE — Telephone Encounter (Signed)
Sent imitrex

## 2013-10-24 NOTE — Telephone Encounter (Signed)
Attempted to call pt informing Imitrex rx sent in, but not able to completely get through.

## 2013-10-24 NOTE — Telephone Encounter (Signed)
LM with new script sent to the pharmacy.

## 2013-11-06 ENCOUNTER — Encounter: Payer: Self-pay | Admitting: Certified Nurse Midwife

## 2013-11-06 ENCOUNTER — Ambulatory Visit (INDEPENDENT_AMBULATORY_CARE_PROVIDER_SITE_OTHER): Payer: BC Managed Care – PPO | Admitting: Certified Nurse Midwife

## 2013-11-06 VITALS — BP 100/78 | HR 72 | Ht 62.0 in | Wt 132.0 lb

## 2013-11-06 DIAGNOSIS — R6889 Other general symptoms and signs: Secondary | ICD-10-CM

## 2013-11-06 DIAGNOSIS — E049 Nontoxic goiter, unspecified: Secondary | ICD-10-CM

## 2013-11-06 DIAGNOSIS — Z0000 Encounter for general adult medical examination without abnormal findings: Secondary | ICD-10-CM

## 2013-11-06 DIAGNOSIS — Z01411 Encounter for gynecological examination (general) (routine) with abnormal findings: Secondary | ICD-10-CM

## 2013-11-06 DIAGNOSIS — Z124 Encounter for screening for malignant neoplasm of cervix: Secondary | ICD-10-CM

## 2013-11-06 LAB — THYROID PANEL WITH TSH
Free Thyroxine Index: 2.3 (ref 1.4–3.8)
T3 Uptake: 29 % (ref 22.0–35.0)
T4, Total: 7.9 ug/dL (ref 4.5–12.0)
TSH: 1.386 u[IU]/mL (ref 0.350–4.500)

## 2013-11-06 LAB — COMPREHENSIVE METABOLIC PANEL
ALK PHOS: 50 U/L (ref 39–117)
ALT: 11 U/L (ref 0–35)
AST: 14 U/L (ref 0–37)
Albumin: 4.4 g/dL (ref 3.5–5.2)
BILIRUBIN TOTAL: 0.4 mg/dL (ref 0.2–1.2)
BUN: 5 mg/dL — AB (ref 6–23)
CO2: 25 mEq/L (ref 19–32)
Calcium: 9.3 mg/dL (ref 8.4–10.5)
Chloride: 103 mEq/L (ref 96–112)
Creat: 0.78 mg/dL (ref 0.50–1.10)
GLUCOSE: 104 mg/dL — AB (ref 70–99)
Potassium: 4 mEq/L (ref 3.5–5.3)
Sodium: 138 mEq/L (ref 135–145)
Total Protein: 7.2 g/dL (ref 6.0–8.3)

## 2013-11-06 LAB — CBC WITH DIFFERENTIAL/PLATELET
Basophils Absolute: 0 10*3/uL (ref 0.0–0.1)
Basophils Relative: 0 % (ref 0–1)
EOS ABS: 0.3 10*3/uL (ref 0.0–0.7)
EOS PCT: 2 % (ref 0–5)
HCT: 37.7 % (ref 36.0–46.0)
HEMOGLOBIN: 12.6 g/dL (ref 12.0–15.0)
LYMPHS ABS: 1.8 10*3/uL (ref 0.7–4.0)
Lymphocytes Relative: 11 % — ABNORMAL LOW (ref 12–46)
MCH: 28.4 pg (ref 26.0–34.0)
MCHC: 33.4 g/dL (ref 30.0–36.0)
MCV: 84.9 fL (ref 78.0–100.0)
MONOS PCT: 6 % (ref 3–12)
Monocytes Absolute: 1 10*3/uL (ref 0.1–1.0)
Neutro Abs: 13.1 10*3/uL — ABNORMAL HIGH (ref 1.7–7.7)
Neutrophils Relative %: 81 % — ABNORMAL HIGH (ref 43–77)
Platelets: 219 10*3/uL (ref 150–400)
RBC: 4.44 MIL/uL (ref 3.87–5.11)
RDW: 14.8 % (ref 11.5–15.5)
WBC: 16.2 10*3/uL — ABNORMAL HIGH (ref 4.0–10.5)

## 2013-11-06 LAB — POCT URINALYSIS DIPSTICK
Bilirubin, UA: NEGATIVE
Blood, UA: NEGATIVE
Glucose, UA: NEGATIVE
LEUKOCYTES UA: NEGATIVE
Nitrite, UA: NEGATIVE
Protein, UA: NEGATIVE
Urobilinogen, UA: NEGATIVE
pH, UA: 5

## 2013-11-06 LAB — LIPID PANEL
CHOL/HDL RATIO: 5.4 ratio
CHOLESTEROL: 190 mg/dL (ref 0–200)
HDL: 35 mg/dL — ABNORMAL LOW (ref >39)
LDL Cholesterol: 128 mg/dL — ABNORMAL HIGH (ref 0–99)
Triglycerides: 136 mg/dL (ref <150)
VLDL: 27 mg/dL (ref 0–40)

## 2013-11-06 LAB — RPR

## 2013-11-06 LAB — HIV ANTIBODY (ROUTINE TESTING W REFLEX): HIV: NONREACTIVE

## 2013-11-06 LAB — HEMOGLOBIN, FINGERSTICK: Hemoglobin, fingerstick: 12.9 g/dL (ref 12.0–16.0)

## 2013-11-06 NOTE — Progress Notes (Signed)
38 y.o. [id number] Single African American Fe here for annual exam. Periods normal no issues. Contraception none, same partner 7  Years. Patient has been working on weight loss over the past 6 months, but also has appetite change since working on smoking cessation. Now down to less than 3 cigarettes daily!. 24 pound weight loss with clean eating. No STD concerns but desires testing today. Fasting for labs this am also. Denies any bowel or bladder changes.Sees urgent care if problems. Has cold today no fever treating with OTC. No other health issues today.  Patient's last menstrual period was 10/15/2013.          Sexually active: Yes.    The current method of family planning is none.    Exercising: Yes.    Home exercise routine includes walking about 5 miles a week. Smoker:  yes  Health Maintenance: Pap:  08/03/12 Neg HR HPV MMG:  never Colonoscopy:  never BMD:   never TDaP:  Speak to provider first Labs:  HgB: 12.9       UA: moderate ketones, ph: 5.0 fasted for labs   reports that she has been smoking.  She has never used smokeless tobacco. She reports that she drinks alcohol. She reports that she does not use illicit drugs.  Past Medical History  Diagnosis Date  . Preterm labor   . Missed abortion current  . Abnormal Pap smear 2009    with colpo/bx + HPV- did f/u  . Chlamydia   . STD (sexually transmitted disease)     Past Surgical History  Procedure Laterality Date  . Dilation and evacuation  08/02/2011    Procedure: DILATATION AND EVACUATION;  Surgeon: Annamaria BootsMary Suzanne Miller, MD;  Location: [facility];  Service: Gynecology;  Laterality: N/A;  . Colposcopy  2009  . Cesarean section  1995    emergency -general anesthesia  . Dilation and curettage of uterus  7/13    Current Outpatient Prescriptions  Medication Sig Dispense Refill  . Ascorbic Acid (VITAMIN C PO) Take by mouth as needed.       . Cholecalciferol (VITAMIN D PO) Take by mouth as needed.       . Multiple Vitamins-Minerals  (MULTIVITAMIN WITH MINERALS) tablet Take 1 tablet by mouth daily.      . SUMAtriptan (IMITREX) 100 MG tablet Take 1 tablet (100 mg total) by mouth once. May repeat in 2 hours if headache persists or recurs.  10 tablet  12   No current facility-administered medications for this visit.    Family History  Problem Relation Age of Onset  . Thyroid disease Sister     ROS:  Pertinent items are noted in HPI.  Otherwise, a comprehensive ROS was negative.  Exam:   BP 100/78  Pulse 72  Ht 5\' 2"  (1.575 m)  Wt 132 lb (59.875 kg)  BMI 24.14 kg/m2  LMP 10/15/2013 Height: 5\' 2"  (157.5 cm)  Ht Readings from Last 3 Encounters:  11/06/13 5\' 2"  (1.575 m)  10/05/13 5\' 2"  (1.575 m)  10/04/13 5' 3.5" (1.613 m)    General appearance: alert, cooperative and appears stated age Head: Normocephalic, without obvious abnormality, atraumatic Neck: no adenopathy, supple, symmetrical, trachea midline and thyroid enlarged and nodular Lungs: clear to auscultation bilaterally Breasts: normal appearance, no masses or tenderness, No nipple retraction or dimpling, No nipple discharge or bleeding, No axillary or supraclavicular adenopathy Heart: regular rate and rhythm Abdomen: soft, non-tender; no masses,  no organomegaly Extremities: extremities normal, atraumatic, no cyanosis or  edema Skin: Skin color, texture, turgor normal. No rashes or lesions Lymph nodes: Cervical, supraclavicular, and axillary nodes normal. No abnormal inguinal nodes palpated Neurologic: Grossly normal   Pelvic: External genitalia:  no lesions              Urethra:  normal appearing urethra with no masses, tenderness or lesions              Bartholin's and Skene's: normal                 Vagina: normal appearing vagina with normal color and discharge, no lesions              Cervix: normal appearance, non tender, no lesions              Pap taken: Yes.   Bimanual Exam:  Uterus:  normal size, contour, position, consistency, mobility,  non-tender and anteflexed              Adnexa: normal adnexa and no mass, fullness, tenderness               Rectovaginal: Confirms               Anus:  normal sphincter tone, no lesions  A:  Well Woman with normal exam  Contraception none desired  STD screening  Appetite change with planned weight loss  Smoking cessation in progress  Screening labs  Enlarged thyroid with small nodules palpated  P:   Reviewed health and wellness pertinent to exam  Lab: HIV,RPR,GC,Chlamydia, CMP,Lipid panel, CBC with diff,Thyroid panel with TSH  Discussed appetite changes with not smoking and eating different foods, a normal response.  Congratulated on smoking progress and continue on!  Discussed thyroid findings and need for evaluation with [place]. Patient agreeable. Will schedule [place].  Pap smear taken today, patient concerned.  counseled on breast self exam, STD prevention, HIV risk factors and prevention, adequate intake of calcium and vitamin D, diet and exercise  return annually or prn  An After Visit Summary was printed and given to the patient.

## 2013-11-06 NOTE — Patient Instructions (Signed)

## 2013-11-07 LAB — IPS PAP TEST WITH REFLEX TO HPV

## 2013-11-07 NOTE — Progress Notes (Signed)
Reviewed personally.  M. Suzanne Kalecia Hartney, MD.  

## 2013-11-08 LAB — IPS N GONORRHOEA AND CHLAMYDIA BY PCR

## 2013-11-09 NOTE — Addendum Note (Signed)
Addended by: Verner CholLEONARD, DEBORAH S on: 11/09/2013 06:12 PM   Modules accepted: Orders

## 2013-11-12 ENCOUNTER — Telehealth: Payer: Self-pay

## 2013-11-12 NOTE — Telephone Encounter (Signed)
lmtcb

## 2013-11-12 NOTE — Telephone Encounter (Signed)
Message copied by Eliezer BottomJOHNSON, Traver Meckes J on Mon Nov 12, 2013 11:07 AM ------      Message from: Verner CholLEONARD, DEBORAH S      Created: Fri Nov 09, 2013  6:10 PM       Notify patient that her HIV,RPR,Gc,Chlamydia are negative      Thyroid and panel normal      Cholesterol panel has low HDL protective cholesterol, needs to work on exercise.LDL optimal level      Glucose was just borderline high work , need to work on good complex carbohydrates in diet      CBC WBC count elevated but could be due to her cold issues when seen needs repeat in one week order in ------

## 2013-11-12 NOTE — Telephone Encounter (Signed)
Patient notified of results. See lab 

## 2013-11-19 ENCOUNTER — Other Ambulatory Visit: Payer: BC Managed Care – PPO

## 2013-11-20 ENCOUNTER — Other Ambulatory Visit: Payer: BC Managed Care – PPO

## 2013-11-20 DIAGNOSIS — R6889 Other general symptoms and signs: Secondary | ICD-10-CM

## 2013-11-21 LAB — CBC WITH DIFFERENTIAL/PLATELET
BASOS ABS: 0 10*3/uL (ref 0.0–0.1)
Basophils Relative: 0 % (ref 0–1)
EOS PCT: 1 % (ref 0–5)
Eosinophils Absolute: 0.1 10*3/uL (ref 0.0–0.7)
HEMATOCRIT: 36.1 % (ref 36.0–46.0)
Hemoglobin: 11.6 g/dL — ABNORMAL LOW (ref 12.0–15.0)
LYMPHS PCT: 28 % (ref 12–46)
Lymphs Abs: 3.1 10*3/uL (ref 0.7–4.0)
MCH: 28.5 pg (ref 26.0–34.0)
MCHC: 32.1 g/dL (ref 30.0–36.0)
MCV: 88.7 fL (ref 78.0–100.0)
MONO ABS: 0.6 10*3/uL (ref 0.1–1.0)
Monocytes Relative: 5 % (ref 3–12)
Neutro Abs: 7.3 10*3/uL (ref 1.7–7.7)
Neutrophils Relative %: 66 % (ref 43–77)
Platelets: 242 10*3/uL (ref 150–400)
RBC: 4.07 MIL/uL (ref 3.87–5.11)
RDW: 15 % (ref 11.5–15.5)
WBC: 11.1 10*3/uL — AB (ref 4.0–10.5)

## 2013-12-03 ENCOUNTER — Encounter: Payer: Self-pay | Admitting: Certified Nurse Midwife

## 2013-12-06 ENCOUNTER — Other Ambulatory Visit: Payer: BC Managed Care – PPO

## 2013-12-11 ENCOUNTER — Other Ambulatory Visit: Payer: Self-pay

## 2013-12-19 ENCOUNTER — Telehealth: Payer: Self-pay | Admitting: Certified Nurse Midwife

## 2013-12-19 NOTE — Telephone Encounter (Signed)
Spoke with patient. Patient states that her LMP started on 11/7 and ended 11/11. On 11/13 patient began to have spotting that was bright red initially but is now dark brown. "I notice it mainly when I go to the bathroom it is dripping and there is a lot when I wipe. There are a few clots. I have dull pain in my abdomen. It isn't that bad but I feel off." Patient is not currently on any form of birth control. Patient took UPT two days ago which was negative. States she has also had a "lingering migraine for 3 days." Advised patient will need to be seen for evaluation with Verner Choleborah S. Leonard CNM. Patient is agreeable. Appointment scheduled for tomorrow 11/19 at 2:30pm. Patient is agreeable to date and time.   Routing to provider for final review. Patient agreeable to disposition. Will close encounter

## 2013-12-19 NOTE — Telephone Encounter (Signed)
Pt has been having problems with spotting between her periods. Please call to schedule.

## 2013-12-20 ENCOUNTER — Ambulatory Visit (INDEPENDENT_AMBULATORY_CARE_PROVIDER_SITE_OTHER): Payer: BC Managed Care – PPO | Admitting: Certified Nurse Midwife

## 2013-12-20 ENCOUNTER — Encounter: Payer: Self-pay | Admitting: Certified Nurse Midwife

## 2013-12-20 VITALS — BP 110/70 | HR 68 | Temp 97.5°F | Resp 16 | Ht 62.0 in | Wt 131.0 lb

## 2013-12-20 DIAGNOSIS — Z3202 Encounter for pregnancy test, result negative: Secondary | ICD-10-CM

## 2013-12-20 DIAGNOSIS — N926 Irregular menstruation, unspecified: Secondary | ICD-10-CM

## 2013-12-20 LAB — CBC
HCT: 35.3 % — ABNORMAL LOW (ref 36.0–46.0)
Hemoglobin: 11.5 g/dL — ABNORMAL LOW (ref 12.0–15.0)
MCH: 27.8 pg (ref 26.0–34.0)
MCHC: 32.6 g/dL (ref 30.0–36.0)
MCV: 85.5 fL (ref 78.0–100.0)
MPV: 9.9 fL (ref 9.4–12.4)
PLATELETS: 263 10*3/uL (ref 150–400)
RBC: 4.13 MIL/uL (ref 3.87–5.11)
RDW: 15.3 % (ref 11.5–15.5)
WBC: 9.3 10*3/uL (ref 4.0–10.5)

## 2013-12-20 LAB — POCT URINE PREGNANCY: Preg Test, Ur: NEGATIVE

## 2013-12-20 NOTE — Progress Notes (Signed)
[age] y.o. single african [place] female [id number] here with complaint of bleeding after completion of LNMP which started on 12/07/13. Patient had sexual activity 3 day after onset and stopped bleeding she then started again on the 12/14/13 and,continued to see scant amount of light brown discharge on toliet paper.Patient had light bleeding this am with passing of one clot quarter size and now scant bleeding with slight cramping. Contraception none. Last sexual activity a few days prior to menses, in addition to above. Denies pain with intercourse or unusual discharge prior to menses. Denies pelvic pain, fever,chills, nausea or vomiting. No STD concerns, no partner change and recent screening 11/06/13 all negative. No illness or medication use. Denies fatigue or heavy bleeding. No other health issues.  O: Healthy female WDWN, no apparent distress. Affect: Normal, orientation x 3 Skin : warm and dry Abdomen: negative for suprapubic tenderness, soft, non tender, no masses  Pelvic exam: External genital area: normal, no lesions normal female Bladder,Urethra, Urethral meatus: non tender Vagina: normal appearance with scant dark blood present, no clotting noted,Affirm collected Cervix: normal, non tender, scant blood from cervix noted Uterus:normal,non tender, anteverted Adnexa: normal non tender, no fullness or masses   A: Normal pelvic exam DUB one cycle only Contraception none negative UPT Affirm taken   P: Reviewed findings of normal pelvic exam and no heavy bleeding noted. Discussed sometimes the uterus just does not finish all the shedding at one time and she also being going through slight cycle change due to her 20 pound intended weight loss. Discussed ? Pregnancy that did not continue, but negative UPT, but would like do serum HCG, patient agreeable. Patient to continue to monitor bleeding and start on Advil 600mg  every 6 hours to see if bleeding will complete and stop. Patient given warning  signs of heavy bleeding and will advise if change or if cycle has not stopped. Labs:CBC, Qualitative HCG, Affirm Discussed consistent condom use with any sexual activity now, until cycle pattern returns to normal and for prevention of pregnancy if desired.  RV prn

## 2013-12-20 NOTE — Patient Instructions (Signed)
Dysfunctional Uterine Bleeding Normally, menstrual periods begin between ages 11 to 17 in young women. A normal menstrual cycle/period may begin every 23 days up to 35 days and lasts from 1 to 7 days. Around 12 to 14 days before your menstrual period starts, ovulation (ovary produces an egg) occurs. When counting the time between menstrual periods, count from the first day of bleeding of the previous period to the first day of bleeding of the next period. Dysfunctional (abnormal) uterine bleeding is bleeding that is different from a normal menstrual period. Your periods may come earlier or later than usual. They may be lighter, have blood clots or be heavier. You may have bleeding between periods, or you may skip one period or more. You may have bleeding after sexual intercourse, bleeding after menopause, or no menstrual period. CAUSES   Pregnancy (normal, miscarriage, tubal).  IUDs (intrauterine device, birth control).  Birth control pills.  Hormone treatment.  Menopause.  Infection of the cervix.  Blood clotting problems.  Infection of the inside lining of the uterus.  Endometriosis, inside lining of the uterus growing in the pelvis and other female organs.  Adhesions (scar tissue) inside the uterus.  Obesity or severe weight loss.  Uterine polyps inside the uterus.  Cancer of the vagina, cervix, or uterus.  Ovarian cysts or polycystic ovary syndrome.  Medical problems (diabetes, thyroid disease).  Uterine fibroids (noncancerous tumor).  Problems with your female hormones.  Endometrial hyperplasia, very thick lining and enlarged cells inside of the uterus.  Medicines that interfere with ovulation.  Radiation to the pelvis or abdomen.  Chemotherapy. DIAGNOSIS   Your doctor will discuss the history of your menstrual periods, medicines you are taking, changes in your weight, stress in your life, and any medical problems you may have.  Your doctor will do a physical  and pelvic examination.  Your doctor may want to perform certain tests to make a diagnosis, such as:  Pap test.  Blood tests.  Cultures for infection.  CT scan.  Ultrasound.  Hysteroscopy.  Laparoscopy.  MRI.  Hysterosalpingography.  D and C.  Endometrial biopsy. TREATMENT  Treatment will depend on the cause of the dysfunctional uterine bleeding (DUB). Treatment may include:  Observing your menstrual periods for a couple of months.  Prescribing medicines for medical problems, including:  Antibiotics.  Hormones.  Birth control pills.  Removing an IUD (intrauterine device, birth control).  Surgery:  D and C (scrape and remove tissue from inside the uterus).  Laparoscopy (examine inside the abdomen with a lighted tube).  Uterine ablation (destroy lining of the uterus with electrical current, laser, heat, or freezing).  Hysteroscopy (examine cervix and uterus with a lighted tube).  Hysterectomy (remove the uterus). HOME CARE INSTRUCTIONS   If medicines were prescribed, take exactly as directed. Do not change or switch medicines without consulting your caregiver.  Long term heavy bleeding may result in iron deficiency. Your caregiver may have prescribed iron pills. They help replace the iron that your body lost from heavy bleeding. Take exactly as directed.  Do not take aspirin or medicines that contain aspirin one week before or during your menstrual period. Aspirin may make the bleeding worse.  If you need to change your sanitary pad or tampon more than once every 2 hours, stay in bed with your feet elevated and a cold pack on your lower abdomen. Rest as much as possible, until the bleeding stops or slows down.  Eat well-balanced meals. Eat foods high in iron. Examples   are:  Leafy green vegetables.  Whole-grain breads and cereals.  Eggs.  Meat.  Liver.  Do not try to lose weight until the abnormal bleeding has stopped and your blood iron level is  back to normal. Do not lift more than ten pounds or do strenuous activities when you are bleeding.  For a couple of months, make note on your calendar, marking the start and ending of your period, and the type of bleeding (light, medium, heavy, spotting, clots or missed periods). This is for your caregiver to better evaluate your problem. SEEK MEDICAL CARE IF:   You develop nausea (feeling sick to your stomach) and vomiting, dizziness, or diarrhea while you are taking your medicine.  You are getting lightheaded or weak.  You have any problems that may be related to the medicine you are taking.  You develop pain with your DUB.  You want to remove your IUD.  You want to stop or change your birth control pills or hormones.  You have any type of abnormal bleeding mentioned above.  You are over 16 years old and have not had a menstrual period yet.  You are 38 years old and you are still having menstrual periods.  You have any of the symptoms mentioned above.  You develop a rash. SEEK IMMEDIATE MEDICAL CARE IF:   An oral temperature above 102 F (38.9 C) develops.  You develop chills.  You are changing your sanitary pad or tampon more than once an hour.  You develop abdominal pain.  You pass out or faint. Document Released: 01/16/2000 Document Revised: 04/12/2011 Document Reviewed: 12/17/2008 [employer] Patient Information 2015 [employer]. This information is not intended to replace advice given to you by your health care provider. Make sure you discuss any questions you have with your health care provider.  

## 2013-12-21 ENCOUNTER — Other Ambulatory Visit: Payer: Self-pay | Admitting: Nurse Practitioner

## 2013-12-21 LAB — HCG, SERUM, QUALITATIVE: Preg, Serum: NEGATIVE

## 2013-12-21 LAB — WET PREP BY MOLECULAR PROBE
Candida species: NEGATIVE
GARDNERELLA VAGINALIS: POSITIVE — AB
Trichomonas vaginosis: NEGATIVE

## 2013-12-21 MED ORDER — METRONIDAZOLE 0.75 % VA GEL: 1.0000 | Freq: Every day | VAGINAL | Status: AC

## 2013-12-21 NOTE — Progress Notes (Signed)
Reviewed personally.  M. Suzanne Bosco Paparella, MD.  

## 2014-04-03 ENCOUNTER — Telehealth: Payer: Self-pay

## 2014-04-03 NOTE — Telephone Encounter (Signed)
Pt dropped off FMLA ppw on 04/02/14 and was seen by Carmelina DaneJeffery S Anderson, MD at 10/04/2013 3:10 PM for Migraine with aura, without mention of intractable migraine without mention of status migrainosus. Please return to disability department upon completion.

## 2014-04-19 DIAGNOSIS — Z0271 Encounter for disability determination: Secondary | ICD-10-CM
